# Patient Record
Sex: Male | Born: 1985
Health system: Southern US, Community
[De-identification: ages and names within clinical notes are randomized; demographics above are authoritative.]

## PROBLEM LIST (undated history)

## (undated) DIAGNOSIS — F32A Depression, unspecified: Secondary | ICD-10-CM

## (undated) DIAGNOSIS — F419 Anxiety disorder, unspecified: Secondary | ICD-10-CM

## (undated) HISTORY — PX: NO PAST SURGERIES: SHX2092

## (undated) HISTORY — PX: OTHER SURGICAL HISTORY: SHX169

---

## 2015-09-18 ENCOUNTER — Inpatient Hospital Stay
Admission: RE | Admit: 2015-09-18 | Discharge: 2015-09-21 | DRG: 885 | Disposition: A | Discharge status: Home / Self Care | Payer: No Typology Code available for payment source | Source: Intra-hospital | Attending: Psychiatry | Admitting: Psychiatry

## 2015-09-18 ENCOUNTER — Emergency Department
Admission: EM | Admit: 2015-09-18 | Discharge: 2015-09-18 | Disposition: A | Discharge status: Other Acute Inpatient Hospital | Payer: Self-pay | Attending: Emergency Medicine | Admitting: Emergency Medicine

## 2015-09-18 ENCOUNTER — Encounter: Payer: Self-pay | Admitting: Psychiatry

## 2015-09-18 DIAGNOSIS — R45851 Suicidal ideations: Secondary | ICD-10-CM

## 2015-09-18 DIAGNOSIS — Z716 Tobacco abuse counseling: Secondary | ICD-10-CM

## 2015-09-18 DIAGNOSIS — F17200 Nicotine dependence, unspecified, uncomplicated: Secondary | ICD-10-CM | POA: Insufficient documentation

## 2015-09-18 DIAGNOSIS — F32A Depression, unspecified: Secondary | ICD-10-CM

## 2015-09-18 DIAGNOSIS — Z888 Allergy status to other drugs, medicaments and biological substances status: Secondary | ICD-10-CM | POA: Diagnosis not present

## 2015-09-18 DIAGNOSIS — F319 Bipolar disorder, unspecified: Principal | ICD-10-CM | POA: Diagnosis present

## 2015-09-18 DIAGNOSIS — F1220 Cannabis dependence, uncomplicated: Secondary | ICD-10-CM | POA: Diagnosis present

## 2015-09-18 DIAGNOSIS — F17210 Nicotine dependence, cigarettes, uncomplicated: Secondary | ICD-10-CM | POA: Diagnosis present

## 2015-09-18 DIAGNOSIS — Z5181 Encounter for therapeutic drug level monitoring: Secondary | ICD-10-CM | POA: Insufficient documentation

## 2015-09-18 DIAGNOSIS — F329 Major depressive disorder, single episode, unspecified: Secondary | ICD-10-CM | POA: Insufficient documentation

## 2015-09-18 LAB — COMPREHENSIVE METABOLIC PANEL
ALT: 15 U/L — ABNORMAL LOW (ref 17–63)
ANION GAP: 4 — AB (ref 5–15)
AST: 25 U/L (ref 15–41)
Albumin: 4.4 g/dL (ref 3.5–5.0)
Alkaline Phosphatase: 62 U/L (ref 38–126)
BILIRUBIN TOTAL: 0.8 mg/dL (ref 0.3–1.2)
BUN: 14 mg/dL (ref 6–20)
CALCIUM: 9.5 mg/dL (ref 8.9–10.3)
CO2: 29 mmol/L (ref 22–32)
Chloride: 107 mmol/L (ref 101–111)
Creatinine, Ser: 0.89 mg/dL (ref 0.61–1.24)
GLUCOSE: 107 mg/dL — AB (ref 65–99)
POTASSIUM: 3.7 mmol/L (ref 3.5–5.1)
Sodium: 140 mmol/L (ref 135–145)
TOTAL PROTEIN: 7.2 g/dL (ref 6.5–8.1)

## 2015-09-18 LAB — CBC
HEMATOCRIT: 44.4 % (ref 40.0–52.0)
Hemoglobin: 15.3 g/dL (ref 13.0–18.0)
MCH: 29.5 pg (ref 26.0–34.0)
MCHC: 34.5 g/dL (ref 32.0–36.0)
MCV: 85.5 fL (ref 80.0–100.0)
Platelets: 159 10*3/uL (ref 150–440)
RBC: 5.2 MIL/uL (ref 4.40–5.90)
RDW: 13.5 % (ref 11.5–14.5)
WBC: 6.7 10*3/uL (ref 3.8–10.6)

## 2015-09-18 LAB — SALICYLATE LEVEL: Salicylate Lvl: <4 mg/dL (ref 2.8–30.0)

## 2015-09-18 LAB — URINE DRUG SCREEN, QUALITATIVE (ARMC ONLY)
AMPHETAMINES, UR SCREEN: NOT DETECTED
BENZODIAZEPINE, UR SCRN: NOT DETECTED
Barbiturates, Ur Screen: NOT DETECTED
Cannabinoid 50 Ng, Ur ~~LOC~~: POSITIVE — AB
Cocaine Metabolite,Ur ~~LOC~~: NOT DETECTED
MDMA (Ecstasy)Ur Screen: NOT DETECTED
Methadone Scn, Ur: NOT DETECTED
OPIATE, UR SCREEN: NOT DETECTED
PHENCYCLIDINE (PCP) UR S: NOT DETECTED
Tricyclic, Ur Screen: NOT DETECTED

## 2015-09-18 LAB — ETHANOL: Alcohol, Ethyl (B): <5 mg/dL (ref <5)

## 2015-09-18 LAB — ACETAMINOPHEN LEVEL: Acetaminophen (Tylenol), Serum: <10 ug/mL — ABNORMAL LOW (ref 10–30)

## 2015-09-18 MED ORDER — QUETIAPINE FUMARATE 25 MG PO TABS: 100.0000 mg | ORAL_TABLET | Freq: Every day | ORAL | Status: DC

## 2015-09-18 MED ORDER — HYDROXYZINE HCL 25 MG PO TABS
25.0000 mg | ORAL_TABLET | ORAL | Status: DC | PRN
  Administered 2015-09-19: 25 mg via ORAL
  Filled 2015-09-18: qty 1

## 2015-09-18 MED ORDER — NICOTINE 21 MG/24HR TD PT24: 21.0000 mg | MEDICATED_PATCH | Freq: Every day | TRANSDERMAL | Status: DC

## 2015-09-18 MED ORDER — ACETAMINOPHEN 325 MG PO TABS: 650.0000 mg | ORAL_TABLET | Freq: Four times a day (QID) | ORAL | Status: DC | PRN

## 2015-09-18 MED ORDER — TEMAZEPAM 15 MG PO CAPS
15.0000 mg | ORAL_CAPSULE | Freq: Every evening | ORAL | Status: DC | PRN
Start: 2015-09-18 — End: 2015-09-21
  Administered 2015-09-19: 15 mg via ORAL
  Filled 2015-09-18: qty 1

## 2015-09-18 MED ORDER — HYDROXYZINE HCL 25 MG PO TABS: 25.0000 mg | ORAL_TABLET | ORAL | Status: DC | PRN

## 2015-09-18 MED ORDER — ALUM & MAG HYDROXIDE-SIMETH 200-200-20 MG/5ML PO SUSP: 30.0000 mL | ORAL | Status: DC | PRN

## 2015-09-18 MED ORDER — MAGNESIUM HYDROXIDE 400 MG/5ML PO SUSP: 30.0000 mL | Freq: Every day | ORAL | Status: DC | PRN

## 2015-09-18 MED ORDER — TEMAZEPAM 15 MG PO CAPS: 15.0000 mg | ORAL_CAPSULE | Freq: Every evening | ORAL | Status: DC | PRN

## 2015-09-18 MED ORDER — QUETIAPINE FUMARATE 100 MG PO TABS
100.0000 mg | ORAL_TABLET | Freq: Every day | ORAL | Status: DC
  Administered 2015-09-18 – 2015-09-20 (×3): 100 mg via ORAL
  Filled 2015-09-18 (×3): qty 1

## 2015-09-18 NOTE — ED Triage Notes (Addendum)
Pt comes into the ED via EMS from home stating "I need help" I will no kill myself but sometimes I dont know what else to do"..denies any self harm PTA.[name]. Pt [facility] and dx with possible testicular cancer but has not followed up with the specialist.

## 2015-09-18 NOTE — ED Notes (Signed)
ENVIRONMENTAL ASSESSMENT Potentially harmful objects out of patient reach: Yes Personal belongings secured: Yes Patient dressed in hospital provided attire only: Yes Plastic bags out of patient reach: Yes Patient care equipment (cords, cables, call bells, lines, and drains) shortened, removed, or accounted for: Yes Equipment and supplies removed from bottom of stretcher: Yes Potentially toxic materials out of patient reach: Yes Sharps container removed or out of patient reach: Yes  Patient currently in dayroom watching television. No signs of acute distress noted at this time. Maintained on 15 minute checks and observation by security camera for safety.
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
## 2015-09-18 NOTE — ED Notes (Signed)
Patient currently denies SI/HI/AVH and pain. Patient aware of inpatient admission. All belongings given to patient and transferred to unit. Patient escorted with Engineer, materialssecurity officer.

## 2015-09-18 NOTE — ED Provider Notes (Signed)
[facility] Emergency Department Provider Note  ____________________________________________  Time seen: Approximately 1:20 PM  I have reviewed the triage vital signs and the nursing notes.   HISTORY  Chief Complaint Suicidal   HPI Eric Rush is a 30 y.o. male with h/o bipolar disorder not on meds for 11 years who presents for depression. Patient reports that he is feeling very depressed and needs help. He has not been seen by a psychiatrist or been on meds for 11 years. He reports that he has a 14 year old son and he is trying to get well for his son. He denies SI or HI. He does report one prior episode of SA where he tried to hang himself. He denies hallucinations. He smokes MJ but denies any other drugs or alcohol. He reports anger issues as well.  History reviewed. No pertinent past medical history.  There are no active problems to display for this patient.   History reviewed. No pertinent surgical history.  Prior to Admission medications   Not on File    Allergies Depakote [divalproex sodium]  No family history on file.  Social History Social History  Substance Use Topics  . Smoking status: Current Every Day Smoker  . Smokeless tobacco: Never Used  . Alcohol use No    Review of Systems  Constitutional: Negative for fever. Eyes: Negative for visual changes. ENT: Negative for sore throat. Cardiovascular: Negative for chest pain. Respiratory: Negative for shortness of breath. Gastrointestinal: Negative for abdominal pain, vomiting or diarrhea. Genitourinary: Negative for dysuria. Musculoskeletal: Negative for back pain. Skin: Negative for rash. Neurological: Negative for headaches, weakness or numbness. Psych: + depression, no SI or HI  ____________________________________________   PHYSICAL EXAM:  VITAL SIGNS: ED Triage Vitals [09/18/15 1232]  Enc Vitals Group     BP (!) 144/75     Pulse Rate 72     Resp 17   Temp 98.4 F (36.9 C)     Temp Source Oral     SpO2 99 %     Weight 165 lb (74.8 kg)     Height  (1.854 m)     Head Circumference      Peak Flow      Pain Score      Pain Loc      Pain Edu?      Excl. in GC?     Constitutional: Alert and oriented. Well appearing and in no apparent distress. HEENT:      Head: Normocephalic and atraumatic.         Eyes: Conjunctivae are normal. Sclera is non-icteric. EOMI. PERRL      Mouth/Throat: Mucous membranes are moist.       Neck: Supple with no signs of meningismus. Cardiovascular: Regular rate and rhythm. No murmurs, gallops, or rubs. 2+ symmetrical distal pulses are present in all extremities. No JVD. Respiratory: Normal respiratory effort. Lungs are clear to auscultation bilaterally. No wheezes, crackles, or rhonchi.  Gastrointestinal: Soft, non tender, and non distended with positive bowel sounds. No rebound or guarding. Genitourinary: No CVA tenderness. Musculoskeletal: Nontender with normal range of motion in all extremities. No edema, cyanosis, or erythema of extremities. Neurologic: Normal speech and language. Face is symmetric. Moving all extremities. No gross focal neurologic deficits are appreciated. Skin: Skin is warm, dry and intact. No rash noted. Psychiatric: Mood and affect are depressed. Speech and behavior are normal. No SI/HI/hallucinations  ____________________________________________   LABS (all labs ordered are listed, but only  abnormal results are displayed)  Labs Reviewed  COMPREHENSIVE METABOLIC PANEL - Abnormal; Notable for the following:       Result Value   Glucose, Bld 107 (*)    ALT 15 (*)    Anion gap 4 (*)    All other components within normal limits  ACETAMINOPHEN LEVEL - Abnormal; Notable for the following:    Acetaminophen (Tylenol), Serum <10 (*)    All other components within normal limits  URINE DRUG SCREEN, QUALITATIVE ([facility] ONLY) - Abnormal; Notable for the following:    Cannabinoid 50 Ng,  Ur [place] POSITIVE (*)    All other components within normal limits  ETHANOL  SALICYLATE LEVEL  CBC   ____________________________________________  EKG  none ____________________________________________  RADIOLOGY  none  ____________________________________________   PROCEDURES  Procedure(s) performed: None Procedures Critical Care performed:  None ____________________________________________   INITIAL IMPRESSION / ASSESSMENT AND PLAN / ED COURSE  30 y.o. male with h/o bipolar disorder not on meds for 11 years who presents for worsening depression and requesting help. No SI or HI. Patient is here voluntarily and requesting help. He wants to get better for his son. No indication for IVC at this time. Will consult psychiatry.  Clinical Course   _________________________ 4:05 PM on 09/18/2015 -----------------------------------------  Patient is medically cleared and awaiting psychiatric evaluation.  Pertinent labs & imaging results that were available during my care of the patient were reviewed by me and considered in my medical decision making (see chart for details).    ____________________________________________   FINAL CLINICAL IMPRESSION(S) / ED DIAGNOSES  Final diagnoses:  Depression      NEW MEDICATIONS STARTED DURING THIS VISIT:  New Prescriptions   No medications on file     Note:  This document was prepared using Dragon voice recognition software and may include unintentional dictation errors.    Nita Sicklearolina Minnette Merida, MD 09/18/15 [phone]

## 2015-09-18 NOTE — Consult Note (Signed)
[place] [facility] Consult   Reason for Consult:  Consult for 30 year old man who presented himself to the emergency room requesting assessment for depression Referring Physician:  Alfred Levins Patient Identification: Eric Rush MRN:  [id number] Principal Diagnosis: Bipolar 1 disorder, depressed ([place]) Diagnosis:   Patient Active Problem List   Diagnosis Date Noted  . Bipolar 1 disorder, depressed ([place]) [F31.9] 09/18/2015  . Suicidal ideation [R45.851] 09/18/2015    Total Time spent with patient: 1 hour  Subjective:   Eric Rush is a 30 y.o. male patient admitted with "I just need some help".  HPI:  Patient interviewed. Chart reviewed. Labs and vitals reviewed. Case discussed with TTS and emergency room physician. 30 year old man presented to the emergency room stating that he had been feeling depressed and having trouble sleeping and it is all getting worse. He says he was diagnosed with bipolar disorder when he was an adolescent but has not had any treatment since he was 30 years old. His mood stays down and depressed all the time and has been getting worse. He also has frequent problems with anger and irritability and temper outbursts which she regrets. He sleeps poorly at night and says that he has nightmares frequently. Feels down and negative about himself. Has recently been feeling more hopeless and having thoughts of suicide. Wants to get some help because he feels responsible for his infant child now.  Medical history: He says at one time somebody told him he had a nodule on his long but apparently it wasn't thought to be serious enough that anything further was done about it. No other active medical complaints except a history of a hydrocele and enlarged testicle.  Substance abuse history: Says he does not use alcohol. Admits that he smokes marijuana says he doesn't do it very frequently only every couple weeks. Has had times in the past of heavier drug use but  is not doing that currently.  Social history: Patient works as a Theme park manager. Currently he is living with a roommate. Has a 24-year-old child by a next girlfriend. Doesn't have much family support.  Past Psychiatric History: Reports that as a child he was diagnosed with bipolar disorder and had several hospitalizations. He recalls multiple medicines in the past including lithium and Depakote in several antidepressants as well as medicines for attention deficit disorder. Stop getting treatment when he was 31 years old with one exception. About 5 years ago he was hospitalized in [place] after trying to hang himself. No other admitted suicide attempts.  Risk to Self: Suicidal Ideation: Yes-Currently Present Suicidal Intent: No-Not Currently/Within Last 6 Months Is patient at risk for suicide?: No Suicidal Plan?: No-Not Currently/Within Last 6 Months (Pt states his actions are impulsive ) Access to Means: Yes Specify Access to Suicidal Means: Pt attempted and did not follow through  What has been your use of drugs/alcohol within the last 12 months?: THC How many times?: 1 Other Self Harm Risks: Cutting Bx Triggers for Past Attempts: Family contact, Other personal contacts, Spouse contact Intentional Self Injurious Behavior: Cutting Comment - Self Injurious Behavior: Pt has a hx of cutting  Risk to Others: Homicidal Ideation: No Thoughts of Harm to Others: No Current Homicidal Intent: No Current Homicidal Plan: No Access to Homicidal Means: No Identified Victim: N/A History of harm to others?: No Assessment of Violence: None Noted Violent Behavior Description: None noted  Does patient have access to weapons?: No Criminal Charges Pending?: No Does patient have a court date: No Prior  Inpatient Therapy: Prior Inpatient Therapy: Yes Prior Therapy Dates: Prior to age 37 Prior Therapy Facilty/Provider(s): Multiple  (Ages 84 y.o- 30 y.o ) Reason for Treatment: Bipolar Depression  Prior Outpatient  Therapy: Prior Outpatient Therapy: No Prior Therapy Dates: N/A Prior Therapy Facilty/Provider(s): N/A Reason for Treatment: N/A Does patient have an ACCT team?: No Does patient have Intensive In-House Services?  : No Does patient have [facility] services? : No Does patient have P4CC services?: No  Past Medical History: History reviewed. No pertinent past medical history. History reviewed. No pertinent surgical history. Family History: No family history on file. Family Psychiatric  History: He says that he is told that his biological mother had mood problems and drug and alcohol problems but he never knew her personally. Social History:  History  Alcohol Use No     History  Drug use: Unknown  . Types: Marijuana    Social History   Social History  . Marital status: Single    Spouse name: N/A  . Number of children: N/A  . Years of education: N/A   Social History Main Topics  . Smoking status: Current Every Day Smoker  . Smokeless tobacco: Never Used  . Alcohol use No  . Drug use: Unknown    Types: Marijuana  . Sexual activity: Not Asked   Other Topics Concern  . None   Social History Narrative  . None   Additional Social History:    Allergies:   Allergies  Allergen Reactions  . Depakote [Divalproex Sodium] Other (See Comments)    liver    Labs:  Results for orders placed or performed during the hospital encounter of 09/18/15 (from the past 48 hour(s))  Comprehensive metabolic panel     Status: Abnormal   Collection Time: 09/18/15 12:36 PM  Result Value Ref Range   Sodium 140 135 - 145 mmol/L   Potassium 3.7 3.5 - 5.1 mmol/L   Chloride 107 101 - 111 mmol/L   CO2 29 22 - 32 mmol/L   Glucose, Bld 107 (H) 65 - 99 mg/dL   BUN 14 6 - 20 mg/dL   Creatinine, Ser 0.89 0.61 - 1.24 mg/dL   Calcium 9.5 8.9 - 10.3 mg/dL   Total Protein 7.2 6.5 - 8.1 g/dL   Albumin 4.4 3.5 - 5.0 g/dL   AST 25 15 - 41 U/L   ALT 15 (L) 17 - 63 U/L   Alkaline Phosphatase 62 38 - 126 U/L    Total Bilirubin 0.8 0.3 - 1.2 mg/dL   GFR calc non Af Amer >60 >60 mL/min   GFR calc Af Amer >60 >60 mL/min    Comment: (NOTE) The eGFR has been calculated using the CKD EPI equation. This calculation has not been validated in all clinical situations. eGFR's persistently <60 mL/min signify possible Chronic Kidney Disease.    Anion gap 4 (L) 5 - 15  Ethanol     Status: None   Collection Time: 09/18/15 12:36 PM  Result Value Ref Range   Alcohol, Ethyl (B) <5 <5 mg/dL    Comment:        LOWEST DETECTABLE LIMIT FOR SERUM ALCOHOL IS 5 mg/dL FOR MEDICAL PURPOSES ONLY   Salicylate level     Status: None   Collection Time: 09/18/15 12:36 PM  Result Value Ref Range   Salicylate Lvl <7.1 2.8 - 30.0 mg/dL  Acetaminophen level     Status: Abnormal   Collection Time: 09/18/15 12:36 PM  Result Value Ref Range  Acetaminophen (Tylenol), Serum <10 (L) 10 - 30 ug/mL    Comment:        THERAPEUTIC CONCENTRATIONS VARY SIGNIFICANTLY. A RANGE OF 10-30 ug/mL MAY BE AN EFFECTIVE CONCENTRATION FOR MANY PATIENTS. HOWEVER, SOME ARE BEST TREATED AT CONCENTRATIONS OUTSIDE THIS RANGE. ACETAMINOPHEN CONCENTRATIONS >150 ug/mL AT 4 HOURS AFTER INGESTION AND >50 ug/mL AT 12 HOURS AFTER INGESTION ARE OFTEN ASSOCIATED WITH TOXIC REACTIONS.   cbc     Status: None   Collection Time: 09/18/15 12:36 PM  Result Value Ref Range   WBC 6.7 3.8 - 10.6 K/uL   RBC 5.20 4.40 - 5.90 MIL/uL   Hemoglobin 15.3 13.0 - 18.0 g/dL   HCT 44.4 40.0 - 52.0 %   MCV 85.5 80.0 - 100.0 fL   MCH 29.5 26.0 - 34.0 pg   MCHC 34.5 32.0 - 36.0 g/dL   RDW 13.5 11.5 - 14.5 %   Platelets 159 150 - 440 K/uL  Urine Drug Screen, Qualitative     Status: Abnormal   Collection Time: 09/18/15 12:36 PM  Result Value Ref Range   Tricyclic, Ur Screen NONE DETECTED NONE DETECTED   Amphetamines, Ur Screen NONE DETECTED NONE DETECTED   MDMA (Ecstasy)Ur Screen NONE DETECTED NONE DETECTED   Cocaine Metabolite,Ur [place] NONE DETECTED NONE  DETECTED   Opiate, Ur Screen NONE DETECTED NONE DETECTED   Phencyclidine (PCP) Ur S NONE DETECTED NONE DETECTED   Cannabinoid 50 Ng, Ur [place] POSITIVE (A) NONE DETECTED   Barbiturates, Ur Screen NONE DETECTED NONE DETECTED   Benzodiazepine, Ur Scrn NONE DETECTED NONE DETECTED   Methadone Scn, Ur NONE DETECTED NONE DETECTED    Comment: (NOTE) 062  Tricyclics, urine               Cutoff 1000 ng/mL 200  Amphetamines, urine             Cutoff 1000 ng/mL 300  MDMA (Ecstasy), urine           Cutoff 500 ng/mL 400  Cocaine Metabolite, urine       Cutoff 300 ng/mL 500  Opiate, urine                   Cutoff 300 ng/mL 600  Phencyclidine (PCP), urine      Cutoff 25 ng/mL 700  Cannabinoid, urine              Cutoff 50 ng/mL 800  Barbiturates, urine             Cutoff 200 ng/mL 900  Benzodiazepine, urine           Cutoff 200 ng/mL 1000 Methadone, urine                Cutoff 300 ng/mL 1100 1200 The urine drug screen provides only a preliminary, unconfirmed 1300 analytical test result and should not be used for non-medical 1400 purposes. Clinical consideration and professional judgment should 1500 be applied to any positive drug screen result due to possible 1600 interfering substances. A more specific alternate chemical method 1700 must be used in order to obtain a confirmed analytical result.  1800 Gas chromato graphy / mass spectrometry (GC/MS) is the preferred 1900 confirmatory method.     Current Facility-Administered Medications  Medication Dose Route Frequency Provider Last Rate Last Dose  . hydrOXYzine (ATARAX/VISTARIL) tablet 25 mg  25 mg Oral Q4H PRN Gonzella Lex, MD      . temazepam (RESTORIL) capsule 15 mg  15 mg Oral QHS  PRN Gonzella Lex, MD       No current outpatient prescriptions on file.    Musculoskeletal: Strength & Muscle Tone: within normal limits Gait & Station: normal Patient leans: N/A  Psychiatric Specialty Exam: Physical Exam  Nursing note and vitals  reviewed. Constitutional: He appears well-developed and well-nourished.  HENT:  Head: Normocephalic and atraumatic.  Eyes: Conjunctivae are normal. Pupils are equal, round, and reactive to light.  Neck: Normal range of motion.  Cardiovascular: Regular rhythm and normal heart sounds.   Respiratory: Effort normal. No respiratory distress.  GI: Soft.  Musculoskeletal: Normal range of motion.  Neurological: He is alert.  Skin: Skin is warm and dry.  Psychiatric: Judgment normal. His speech is delayed. He is slowed. Cognition and memory are normal. He exhibits a depressed mood. He expresses suicidal ideation. He expresses no suicidal plans.    Review of Systems  Constitutional: Negative.   HENT: Negative.   Eyes: Negative.   Respiratory: Negative.   Cardiovascular: Negative.   Gastrointestinal: Negative.   Musculoskeletal: Negative.   Skin: Negative.   Neurological: Negative.   Psychiatric/Behavioral: Positive for depression, substance abuse and suicidal ideas. Negative for hallucinations and memory loss. The patient is nervous/anxious and has insomnia.     Blood pressure (!) 144/75, pulse 72, temperature 98.4 F (36.9 C), temperature source Oral, resp. rate 17, height 6' 1"  (1.854 m), weight 74.8 kg (165 lb), SpO2 99 %.Body mass index is 21.77 kg/m.  General Appearance: Casual  Eye Contact:  Fair  Speech:  Normal Rate  Volume:  Decreased  Mood:  Anxious and Depressed  Affect:  Constricted and Tearful  Thought Process:  Goal Directed  Orientation:  Full (Time, Place, and Person)  Thought Content:  Logical  Suicidal Thoughts:  Yes.  without intent/plan  Homicidal Thoughts:  No  Memory:  Immediate;   Good Recent;   Poor Remote;   Fair  Judgement:  Fair  Insight:  Fair  Psychomotor Activity:  Decreased  Concentration:  Concentration: Fair  Recall:  [employer] of Knowledge:  Fair  Language:  Fair  Akathisia:  No  Handed:  Right  AIMS (if indicated):     Assets:   Communication Skills Desire for Improvement Housing Physical Health Resilience  ADL's:  Intact  Cognition:  WNL  Sleep:        Treatment Plan Summary: Daily contact with patient to assess and evaluate symptoms and progress in treatment, Medication management and Plan 30 year old man who is reporting multiple symptoms of depression including depressed sad mood, anxiety, irritability. Frequent suicidal thoughts. Feeling like he is out of control. History of bipolar diagnosis in the past. Patient will be admitted to the psychiatric ward. I will initiate low dose of Seroquel at night to begin treatment of bipolar depression. When necessary medication will be provided for anxiety. Full lab checks will be completed including hemoglobin A1c and lipid panel. Patient agrees to plan.  Disposition: Recommend psychiatric Inpatient admission when medically cleared.  Alethia Berthold, MD 09/18/2015 4:58 PM

## 2015-09-18 NOTE — ED Notes (Signed)
Patient was brought to the emergency room to be evaluated after feeling symptoms of bipolar disorder (not sleeping well, becoming enraged all of a sudden, racing thoughts). Patient a week ago took a bottle of sleeping pills in his mouth and attempted to chew them but couldn't go through with it. Patient recently was estranged from his girlfriend and only has limited visits with his son. Patient currently denies SI but feels hopeless, denies HI/AVH and pain. Patient agreeable with plan to go to the inpatient unit.  Patient is calm and cooperative, no signs of acute distress noted. Maintained on 15 minute checks and observation by security camera for safety.

## 2015-09-18 NOTE — ED Notes (Signed)
Patient stated that he did not have any numbers memorized in order to let mother of child know that he was admitted to the hospital. Patient requested that staff find the number for [employer] which is his employer so that he could let them know he was in the hospital. Staff found number, patient requested that it be placed with his belongings.

## 2015-09-19 DIAGNOSIS — F319 Bipolar disorder, unspecified: Principal | ICD-10-CM

## 2015-09-19 LAB — LIPID PANEL
CHOL/HDL RATIO: 3.6 ratio
Cholesterol: 149 mg/dL (ref 0–200)
HDL: 41 mg/dL (ref >40)
LDL CALC: 88 mg/dL (ref 0–99)
Triglycerides: 98 mg/dL (ref <150)
VLDL: 20 mg/dL (ref 0–40)

## 2015-09-19 LAB — HEMOGLOBIN A1C: Hgb A1c MFr Bld: 5.5 % (ref 4.0–6.0)

## 2015-09-19 LAB — TSH: TSH: 1.809 u[IU]/mL (ref 0.350–4.500)

## 2015-09-19 MED ORDER — OXCARBAZEPINE 150 MG PO TABS
150.0000 mg | ORAL_TABLET | Freq: Two times a day (BID) | ORAL | Status: DC
  Administered 2015-09-19 – 2015-09-21 (×5): 150 mg via ORAL
  Filled 2015-09-19 (×5): qty 1
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
## 2015-09-19 NOTE — Care Management (Signed)
Eric Rush seems to be in better shape then on his arrival. Chaplain made sure to offer counseling and support. Currently, Eric Rush feels better and does not desire any services, however the Chaplain will check in with him later on in the day. His demeanor was very welcoming and warm this morning.

## 2015-09-19 NOTE — [facility] Suicide Risk Assessment (Signed)
[facility] INPATIENT:  Family/Significant Other Suicide Prevention Education  Suicide Prevention Education:  Patient Refusal for Family/Significant Other Suicide Prevention Education: The patient Eric Rush has refused to provide written consent for family/significant other to be provided Family/Significant Other Suicide Prevention Education during admission and/or prior to discharge.  Physician notified. Pt stated he wants to get treatment and will contact collateral when he gets better.   Lynden OxfordKadijah R Sebastiana Wuest, MSW, LCSW-A 09/19/2015, 10:56 AM

## 2015-09-19 NOTE — Tx Team (Signed)
Initial Interdisciplinary Treatment Plan   PATIENT STRESSORS: Financial difficulties Marital or family conflict Substance abuse   PATIENT STRENGTHS: Ability for insight Capable of independent living Motivation for treatment/growth   PROBLEM LIST: Problem List/Patient Goals Date to be addressed Date deferred Reason deferred Estimated date of resolution  Mood Liabilities/Swings 09/18/2015     Depressive Symptoms 09/18/2015     Marijuana Dependent 09/18/2015                                          DISCHARGE CRITERIA:  Improved stabilization in mood, thinking, and/or behavior Reduction of life-threatening or endangering symptoms to within safe limits  PRELIMINARY DISCHARGE PLAN: Attend aftercare/continuing care group Return to previous living arrangement  PATIENT/FAMIILY INVOLVEMENT: This treatment plan has been presented to and reviewed with the patient, Lavona Mounderence James Schoonmaker.  The patient was given the opportunity to ask questions and make suggestions.  Ara Mano T Hser Belanger 09/19/2015, 2:22 AM

## 2015-09-19 NOTE — Tx Team (Signed)
Interdisciplinary Treatment Plan Update (Adult)         Date: 09/19/2015   Time Reviewed: 10:30 AM   Progress in Treatment: Improving Attending groups: Yes  Participating in groups: Yes  Taking medication as prescribed: Yes  Tolerating medication: Yes  Family/Significant other contact made: Pt refused family contact Patient understands diagnosis: Yes  Discussing patient identified problems/goals with staff: Yes  Medical problems stabilized or resolved: Yes  Denies suicidal/homicidal ideation: Yes  Issues/concerns per patient self-inventory: Yes  Other:   New problem(s) identified: N/A   Discharge Plan or Barriers: see below    Reason for Continuation of Hospitalization:   Depression   Anxiety   Medication Stabilization   Comments: N/A   Estimated length of stay: 3-5 days    Patient is a 30 year old male admitted for depression and suicidal ideation. Patient lives in [place], [place].   30 year old man presented to the emergency room stating that he had been feeling depressed and having trouble sleeping and it is all getting worse. He says he was diagnosed with bipolar disorder when he was an adolescent but has not had any treatment since he was 30 years old. His mood stays down and depressed all the time and has been getting worse. He also has frequent problems with anger and irritability and temper outbursts which she regrets. He sleeps poorly at night and says that he has nightmares frequently. Feels down and negative about himself. Has recently been feeling more hopeless and having thoughts of suicide. Wants to get some help because he feels responsible for his infant child now.  Patient will benefit from crisis stabilization, medication evaluation, group therapy, and psycho education in addition to case management for discharge planning. Patient and CSW reviewed pt's identified goals and treatment plan. Pt verbalized understanding and agreed to treatment plan.    Review of  initial/current patient goals per problem list:  1. Goal(s): Patient will participate in aftercare plan   Met: Goal progressing   Target date: 3-5 days post admission date   As evidenced by: Patient will participate within aftercare plan AEB aftercare provider and housing plan at discharge being identified.   CSW still assessing proper aftercare plans at this time. Possible [place] follow-up.  2. Goal (s): Patient will exhibit decreased depressive symptoms and suicidal ideations.   Met: No  Target date: 3-5 days post admission date   As evidenced by: Patient will utilize self-rating of depression at 3 or below and demonstrate decreased signs of depression or be deemed stable for discharge by MD.   Pt reports a depression score of a 6 at this time. Hoping that getting back on his medications will help with depressive symptoms.  3. Goal(s): Patient will demonstrate decreased signs and symptoms of anxiety.   Met: Goal progressing  Target date: 3-5 days post admission date   As evidenced by: Patient will utilize self-rating of anxiety at 3 or below and demonstrated decreased signs of anxiety, or be deemed stable for discharge by MD   Patient reports an anxiety score of 4 at this time. States he has not been able to sleep lately and is causing anxiety symptoms.  Attendees:  Patient: Eric Rush Family:  Physician: Jennye Boroughs, MD    09/19/2015 10:30 AM  Nursing: Polly Cobia, RN     09/19/2015 10:30 AM  Clinical Social Worker: Emilie Rutter, LCSWA  09/19/2015 10:30 AM
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
## 2015-09-19 NOTE — Plan of Care (Signed)
Problem: Education: Goal: Knowledge of [facility] Education information/materials will improve Outcome: Not Progressing Review of current plan of care. Patient sleepy in bed this am. Therapy groups encouraged.

## 2015-09-19 NOTE — Progress Notes (Signed)
A&Ox3, denied pain, "Hopeful, I want to be there for my 29 year old son .... I checked myself in, I brought myself in because of my mood, I can be happy now and then crash ..." Observed interacting well with peers in the Day Room.

## 2015-09-19 NOTE — Progress Notes (Signed)
Recreation Therapy Notes  INPATIENT RECREATION THERAPY ASSESSMENT  Patient Details Name: Lavona Mounderence James Sweezy MRN: [id number] DOB: 1985/02/24 Today's Date: 09/19/2015  Patient Stressors: Relationship, Friends, Other (Comment) (Girlfriend broke up with him 2 months ago because he had a one night stand and got another girl pregnant; lack of supportive friends; doesn't know what is going on with son - son's mother doesn't hang around good people - she uses son to make pt do thing)  Coping Skills:   Isolate, Substance Abuse, Exercise, Art/Dance, Talking, Music, Sports, Other (Comment) (Punching bag)  Personal Challenges: Anger, Communication, Concentration, Decision-Making, Expressing Yourself, Problem-Solving, Relationships, Self-Esteem/Confidence, Restaurant manager, fast foodocial Interaction, Stress Management, Trusting Others  Leisure Interests (2+):  Music - Write music, Individual - Other (Comment) Research officer, trade union(Skateboarding)  Awareness of Community Resources:  Yes  Community Resources:  [employer], [employer]  Current Use: Yes  If no, Barriers?:    Patient Strengths:  Great father, determined  Patient Identified Areas of Improvement:  All the thing I said I struggled with  Current Recreation Participation:  Writing music, skateboarding  Patient Goal for Hospitalization:  Get back on the right medication and learn what he can out of group and get group therapy in the community  [place]   Current SI (including self-harm):  No  Current HI:  No  Consent to Intern Participation: N/A   Jacquelynn CreeGreene,Bailley Guilford M, LRT/CTRS 09/19/2015, 3:42 PM

## 2015-09-19 NOTE — Progress Notes (Signed)
Patient with sad affect, cooperative behavior with meals. Minimal interaction with peers. Pleasant when Clinical research associatewriter initiates interaction. No SI/HI/AVH at this time. Sleepy in bed this am. Therapy groups encouraged. Safety maintained.

## 2015-09-19 NOTE — BHH Suicide Risk Assessment (Signed)
[facility] Admission Suicide Risk Assessment   Nursing information obtained from:  Patient Demographic factors:  Male, Caucasian, Low socioeconomic status Current Mental Status:  Suicidal ideation indicated by patient, Suicide plan, Belief that plan would result in death Loss Factors:  Financial problems / change in socioeconomic status Historical Factors:  Impulsivity Risk Reduction Factors:  Responsible for children under 29 years of age, Sense of responsibility to family  Total Time spent with patient: 1 hour Principal Problem: Bipolar 1 disorder, depressed (HCC) Diagnosis:   Patient Active Problem List   Diagnosis Date Noted  . Bipolar 1 disorder, depressed (HCC) [F31.9] 09/18/2015    Priority: High  . Suicidal ideation [R45.851] 09/18/2015  . Cannabis use disorder, moderate, dependence (HCC) [F12.20] 09/18/2015  . Tobacco use disorder [F17.200] 09/18/2015   Subjective Data:  Depression, mood instability, suicida idetion  Continued Clinical Symptoms:  Alcohol Use Disorder Identification Test Final Score (AUDIT): 0 The "Alcohol Use Disorders Identification Test", Guidelines for Use in Primary Care, Second Edition.  World Science writer [facility]). Score between 0-7:  no or low risk or alcohol related problems. Score between 8-15:  moderate risk of alcohol related problems. Score between 16-19:  high risk of alcohol related problems. Score 20 or above:  warrants further diagnostic evaluation for alcohol dependence and treatment.   CLINICAL FACTORS:   Bipolar Disorder:   Depressive phase   Musculoskeletal: Strength & Muscle Tone: within normal limits Gait & Station: normal Patient leans: N/A  Psychiatric Specialty Exam: Physical Exam  Nursing note and vitals reviewed.   Review of Systems  Psychiatric/Behavioral: Positive for depression and suicidal ideas. The patient has insomnia.   All other systems reviewed and are negative.   Blood pressure 124/76, pulse 60, temperature  98.2 F (36.8 C), temperature source Oral, resp. rate 20, height  (1.854 m), weight 68 kg (150 lb), SpO2 100 %.Body mass index is 19.79 kg/m.  General Appearance: Casual  Eye Contact:  Good  Speech:  Clear and Coherent  Volume:  Normal  Mood:  Depressed and Dysphoric  Affect:  Tearful  Thought Process:  Goal Directed  Orientation:  Full (Time, Place, and Person)  Thought Content:  WDL  Suicidal Thoughts:  Yes.  with intent/plan  Homicidal Thoughts:  No  Memory:  Immediate;   Fair Recent;   Fair Remote;   Fair  Judgement:  Fair  Insight:  Fair  Psychomotor Activity:  Normal  Concentration:  Concentration: Fair and Attention Span: Fair  Recall:  [employer] of Knowledge:  Fair  Language:  Fair  Akathisia:  No  Handed:  Right  AIMS (if indicated):     Assets:  Communication Skills Desire for Improvement Financial Resources/Insurance Housing Physical Health Resilience Social Support Transportation Vocational/Educational  ADL's:  Intact  Cognition:  WNL  Sleep:  Number of Hours: 7      COGNITIVE FEATURES THAT CONTRIBUTE TO RISK:  None    SUICIDE RISK:   Moderate:  Frequent suicidal ideation with limited intensity, and duration, some specificity in terms of plans, no associated intent, good self-control, limited dysphoria/symptomatology, some risk factors present, and identifiable protective factors, including available and accessible social support.   PLAN OF CARE:  Hospital admission, medication management, discharge planning.  Mr. Karner  is a 30-ear-old male with a history of bipolar disorder admtted for worsening of dpessonand and suicidal ideation in the conext of social stressors.  1.  Suicidal ideation. The patient is able to contract for safety n the hospital.  2.   Mood,.  He was started on Seoquel and Trileptal for mood stabilization.  3.  Smoking. Nicotine paths available.  4.  Cannabis abuse. The patient minimizes his problems.  5.  Disposition.  He will be discharged to home. [facility].  I certify that inpatient services furnished can reasonably be expected to improve the patient's condition.  Kristine LineaJolanta Maira Christon, MD 09/19/2015, 9:54 AM

## 2015-09-19 NOTE — Progress Notes (Signed)
Recreation Therapy Notes  Date: 08.09.17 Time: 1:15 pm Location: Craft Room  Group Topic: Self-esteem  Goal Area(s) Addresses:  Patient will write at least one positive trait about self. Patient will verbalize benefit of having healthy self-esteem.  Behavioral Response: Attentive, Interactive  Intervention: I Am  Activity: Patients were given a worksheet with the letter I on it and instructed to write as many positive traits about themselves inside the letter.  Education: LRT educated patients on ways they can increase their self-esteem.  Education Outcome: Acknowledges education/In group clarification offered   Clinical Observations/Feedback: Patient completed activity by writing positive traits about himself. Patient contributed to group discussion by stating that it was easy and difficult to think of positive traits and why, how it felt to list positive traits, how his self-esteem affects him, and how he can increase his self-esteem.  Jacquelynn CreeGreene,Timothy Townsel M, LRT/CTRS 09/19/2015 3:24 PM

## 2015-09-19 NOTE — BHH Group Notes (Signed)
BHH LCSW Group Therapy  09/19/2015 12:07 PM  Type of Therapy:  Group Therapy  Participation Level:  Pt did not attend group. CSW invited pt to group.   Summary of Progress/Problems: Emotional Regulation: Patients will identify both negative and positive emotions. They will discuss emotions they have difficulty regulating and how they impact their lives. Patients will be asked to identify healthy coping skills to combat unhealthy reactions to negative emotions.    Lacey Wallman G. Garnette CzechSampson MSW, LCSWA 09/19/2015, 12:07 PM

## 2015-09-19 NOTE — BHH Group Notes (Signed)
BHH Group Notes:  (Nursing/MHT/Case Management/Adjunct)  Date:  09/19/2015  Time:  5:06 PM  Type of Therapy:  Psychoeducational Skills  Participation Level:  Active  Participation Quality:  Appropriate  Affect:  Appropriate  Cognitive:  Appropriate  Insight:  Appropriate  Engagement in Group:  Engaged and Supportive  Modes of Intervention:  Discussion and Education  Summary of Progress/Problems:  Eric PennerKristen J Saydie Rush 09/19/2015, 5:06 PM

## 2015-09-19 NOTE — Plan of Care (Signed)
Problem: Safety: Goal: Ability to remain free from injury will improve Outcome: Progressing Medications administered as ordered (Seroquel 100 mg qhs)by the physician, medications Therapeutic Effects, SEs and Adverse effects discussed, questions encouraged; no PRN given, 15 minute checks maintained for safety, clinical and moral support provided, patient encouraged to continue to express feelings and demonstrate safe care. Patient remain free from harm, will continue to monitor.

## 2015-09-20 LAB — PROLACTIN: Prolactin: 28.1 ng/mL — ABNORMAL HIGH (ref 4.0–15.2)

## 2015-09-20 MED ORDER — OXCARBAZEPINE 150 MG PO TABS: 150.0000 mg | ORAL_TABLET | Freq: Two times a day (BID) | ORAL | 1 refills | Status: AC

## 2015-09-20 MED ORDER — HYDROXYZINE HCL 25 MG PO TABS: 25.0000 mg | ORAL_TABLET | ORAL | 1 refills | Status: DC | PRN

## 2015-09-20 MED ORDER — QUETIAPINE FUMARATE 100 MG PO TABS: 100.0000 mg | ORAL_TABLET | Freq: Every day | ORAL | 1 refills | Status: DC

## 2015-09-20 MED ORDER — OXCARBAZEPINE 150 MG PO TABS: 150.0000 mg | ORAL_TABLET | Freq: Two times a day (BID) | ORAL | 1 refills | Status: DC

## 2015-09-20 MED ORDER — QUETIAPINE FUMARATE 100 MG PO TABS: 100.0000 mg | ORAL_TABLET | Freq: Every day | ORAL | 1 refills | Status: AC

## 2015-09-20 NOTE — BHH Group Notes (Signed)
BHH LCSW Group Therapy  09/20/2015 10:39 AM  Type of Therapy:  Group Therapy  Participation Level:  Active  Participation Quality:  Appropriate and Sharing  Affect:  Appropriate  Cognitive:  Alert and Appropriate  Insight:  Developing/Improving and Engaged  Engagement in Therapy:  Engaged and Improving  Modes of Intervention:  Activity, Discussion, Education and Support  Summary of Progress/Problems: Balance in life: Patients will discuss the concept of balance and how it looks and feels to be unbalanced. Pt will identify areas in their life that is unbalanced and ways to become more balanced. Pt actively participated in group and was able to develop insight to areas he could make improvements. Pt states his son motivates him and that he wants to maintain his mental health to be a better dad to his son.     Nicey Krah G. Garnette CzechSampson MSW, LCSWA 09/20/2015, 10:43 AM

## 2015-09-20 NOTE — Progress Notes (Signed)
D: Patient is alert and oriented on the unit this shift. Patient attended and   participated in groups today. Patient denies suicidal ideation, homicidal ideation, auditory or visual hallucinations at the present time.  A: Scheduled medications are administered to patient as per MD orders. Emotional support and encouragement are provided. Patient is maintained on q.15 minute safety checks. Patient is informed to notify staff with questions or concerns. R: No adverse medication reactions are noted. Patient is cooperative with medication administration and treatment plan today. Patient is receptive and cooperative on the unit at this time. Patient  Isolative on the unit this shift. Patient contracts for safety at this time. Patient remains safe at this time.

## 2015-09-20 NOTE — BHH Group Notes (Signed)
Goals Group  Date/Time: 09/20/15  10:34AM Type of Therapy and Topic: Group Therapy: Goals Group: SMART Goals  ?  Participation Level: Moderate  ?  Description of Group:  ?  The purpose of a daily goals group is to assist and guide patients in setting recovery/wellness-related goals. The objective is to set goals as they relate to the crisis in which they were admitted. Patients will be using SMART goal modalities to set measurable goals. Characteristics of realistic goals will be discussed and patients will be assisted in setting and processing how one will reach their goal. Facilitator will also assist patients in applying interventions and coping skills learned in psycho-education groups to the SMART goal and process how one will achieve defined goal.  ?  Therapeutic Goals:  ?  -Patients will develop and document one goal related to or their crisis in which brought them into treatment.  -Patients will be guided by LCSW using SMART goal setting modality in how to set a measurable, attainable, realistic and time sensitive goal.  -Patients will process barriers in reaching goal.  -Patients will process interventions in how to overcome and successful in reaching goal.  ?  Patient's Goal: Pt's goal today is to take treatment seriously by taking medications, following up with therapist, and actively participating in groups.  ?  Therapeutic Modalities:  Motivational Interviewing  Cognitive Behavioral Therapy  Crisis Intervention Model  SMART goals setting  Hampton AbbotKadijah Osmany Azer, MSW, LCSW-A 09/20/15  10:34AM

## 2015-09-20 NOTE — Progress Notes (Signed)
Patient is pleasant & cooperative.Verbalized that his depression is better.Denies suicidal or homicidal ideations & AV hallucinations.Stated that he is looking forward to a better life with his family.Attended groups.Compliant with medications.

## 2015-09-20 NOTE — Plan of Care (Signed)
Problem: [facility] Participation in Recreation Therapeutic Interventions Goal: STG-Patient will identify at least five coping skills for ** STG: Coping Skills - Within 4 treatment sessions, patient will verbalize at least 5 coping skills for anger in each of 2 treatment sessions to increase anger management skills post d/c.  Outcome: Progressing Treatment Session 1; Completed 1 out of 2: At approximately 10:50 am, LRT met with patient in craft room. Patient verbalized 5 coping skills for anger. Patient verbalized what triggers him to get angry, how his body responds to anger, and how he is going to remember to use his healthy coping skills. LRT provided suggestions as well.  Leonette Monarch, LRT/CTRS 08.10.17 3:50 pm Goal: STG-Other Recreation Therapy Goal (Specify) STG: Stress Management - Within 4 treatment sessions, patient will verbalize understanding of the stress management techniques in each of 2 treatment sessions to increase stress management skills post d/c.  Outcome: Progressing Treatment Session 1; Completed 1 out of 2: At approximately 10:50 am, LRT met with patient in craft room. LRT educated and provided patient with handouts on stress management techniques. Patient verbalized understanding. LRT encouraged patient to read over and practice the stress management techniques.  Leonette Monarch, LRT/CTRS 08.10.17 3:51 pm

## 2015-09-20 NOTE — BHH Suicide Risk Assessment (Signed)
[facility]   Principal Problem: Bipolar 1 disorder, depressed [facility](HCC) Discharge Diagnoses:  Patient Active Problem List   Diagnosis Date Noted  . Bipolar 1 disorder, depressed (HCC) [F31.9] 09/18/2015    Priority: High  . Suicidal ideation [R45.851] 09/18/2015  . Cannabis use disorder, moderate, dependence (HCC) [F12.20] 09/18/2015  . Tobacco use disorder [F17.200] 09/18/2015    Total Time spent with patient: 30 minutes  Musculoskeletal: Strength & Muscle Tone: within normal limits Gait & Station: normal Patient leans: N/A  Psychiatric Specialty Exam: Review of Systems  Psychiatric/Behavioral: The patient is nervous/anxious.   All other systems reviewed and are negative.   Blood pressure 107/70, pulse 67, temperature 98.4 F (36.9 C), temperature source Oral, resp. rate 20, height 6\' 1"  (1.854 m), weight 68 kg (150 lb), SpO2 100 %.Body mass index is 19.79 kg/m.  General Appearance: Casual  Eye Contact::  Good  Speech:  Clear and Coherent409  Volume:  Normal  Mood:  Anxious  Affect:  Appropriate  Thought Process:  Goal Directed  Orientation:  Full (Time, Place, and Person)  Thought Content:  WDL  Suicidal Thoughts:  No  Homicidal Thoughts:  No  Memory:  Immediate;   Fair Recent;   Fair Remote;   Fair  Judgement:  Impaired  Insight:  Present  Psychomotor Activity:  Normal  Concentration:  Fair  Recall:  [employer]  Fund of Knowledge:Fair  Language: Fair  Akathisia:  No  Handed:  Right  AIMS (if indicated):     Assets:  Communication Skills Desire for Improvement Housing Physical Health Resilience Social Support  Sleep:  Number of Hours: 6.3  Cognition: WNL  ADL's:  Intact   Mental Status Per Nursing Assessment::   On Admission:  Suicidal ideation indicated by patient, Suicide plan, Belief that plan would result in death  Demographic Factors:  Male, Adolescent or young adult and Caucasian  Loss Factors: Loss of significant  relationship  Historical Factors: Family history of mental illness or substance abuse and Impulsivity  Risk Reduction Factors:   Responsible for children under 418 years of age, Sense of responsibility to family, Employed, Living with another person, especially a relative and Positive social support  Continued Clinical Symptoms:  Bipolar Disorder:   Depressive phase Alcohol/Substance Abuse/Dependencies  Cognitive Features That Contribute To Risk:  None    Suicide Risk:  Minimal: No identifiable suicidal ideation.  Patients presenting with no risk factors but with morbid ruminations; may be classified as minimal risk based on the severity of the depressive symptoms  Follow-up Information    [facility]. Go on 09/24/2015.   Why:  Please arrive to the walk-in clinic at [facility] for an assessment for medication management and therapy.  Arrive as early as possible for prompt service. Please call Unk PintoHarvey Bryant at [phone] for questions and assistance.  Contact information: [facility] [address] [place] [address] Ph: [phone] Fax: [phone]          Plan Of Care/Follow-up recommendations:  Activity:  As tolerated. Diet:  Regular. Other:  Keep follow-up appointments.  Kristine LineaJolanta Pucilowska, MD 09/20/2015, 7:08 PM

## 2015-09-20 NOTE — Progress Notes (Signed)
[facility] MD Progress Note  09/20/2015 6:24 PM Randle Shatzer  MRN:  [id number]  Subjective:  Mr. Deridder reports feeling much better today. He slept last night. He tolerates medications well. He no longer is in distress. He denies suicidal or homicidal thoughts and is asking for discharge tomorrow. He is pleasant and cooperative. He interacts with peers and staff appropriately. Good group participation.  Principal Problem: Bipolar 1 disorder, depressed (HCC) Diagnosis:   Patient Active Problem List   Diagnosis Date Noted  . Bipolar 1 disorder, depressed (HCC) [F31.9] 09/18/2015    Priority: High  . Suicidal ideation [R45.851] 09/18/2015  . Cannabis use disorder, moderate, dependence (HCC) [F12.20] 09/18/2015  . Tobacco use disorder [F17.200] 09/18/2015   Total Time spent with patient: 20 minutes  Past Psychiatric History: Bipolar disorder.  Past Medical History: History reviewed. No pertinent past medical history. History reviewed. No pertinent surgical history. Family History: History reviewed. No pertinent family history. Family Psychiatric  History: Bipolar disorder. Social History:  History  Alcohol Use No     History  Drug use: Unknown  . Types: Marijuana    Social History   Social History  . Marital status: Single    Spouse name: N/A  . Number of children: N/A  . Years of education: N/A   Social History Main Topics  . Smoking status: Current Every Day Smoker  . Smokeless tobacco: Never Used  . Alcohol use No  . Drug use: Unknown    Types: Marijuana  . Sexual activity: Not Asked   Other Topics Concern  . None   Social History Narrative  . None   Additional Social History:                         Sleep: Fair  Appetite:  Fair  Current Medications: Current Facility-Administered Medications  Medication Dose Route Frequency Provider Last Rate Last Dose  . acetaminophen (TYLENOL) tablet 650 mg  650 mg Oral Q6H PRN Audery Amel, MD      . alum &  mag hydroxide-simeth (MAALOX/MYLANTA) 200-200-20 MG/5ML suspension 30 mL  30 mL Oral Q4H PRN Audery Amel, MD      . hydrOXYzine (ATARAX/VISTARIL) tablet 25 mg  25 mg Oral Q4H PRN Audery Amel, MD   25 mg at 09/19/15 2153  . magnesium hydroxide (MILK OF MAGNESIA) suspension 30 mL  30 mL Oral Daily PRN Audery Amel, MD      . nicotine (NICODERM CQ - dosed in mg/24 hours) patch 21 mg  21 mg Transdermal Daily Jolanta B Pucilowska, MD      . OXcarbazepine (TRILEPTAL) tablet 150 mg  150 mg Oral BID Shari Prows, MD   150 mg at 09/20/15 0942  . QUEtiapine (SEROQUEL) tablet 100 mg  100 mg Oral QHS Audery Amel, MD   100 mg at 09/19/15 2153  . temazepam (RESTORIL) capsule 15 mg  15 mg Oral QHS PRN Audery Amel, MD   15 mg at 09/19/15 2153    Lab Results:  Results for orders placed or performed during the hospital encounter of 09/18/15 (from the past 48 hour(s))  Hemoglobin A1c     Status: None   Collection Time: 09/19/15  7:38 AM  Result Value Ref Range   Hgb A1c MFr Bld 5.5 4.0 - 6.0 %  Lipid panel, fasting     Status: None   Collection Time: 09/19/15  7:38 AM  Result Value Ref  Range   Cholesterol 149 0 - 200 mg/dL   Triglycerides 98 <782 mg/dL   HDL 41 >95 mg/dL   Total CHOL/HDL Ratio 3.6 RATIO   VLDL 20 0 - 40 mg/dL   LDL Cholesterol 88 0 - 99 mg/dL    Comment:        Total Cholesterol/HDL:CHD Risk Coronary Heart Disease Risk Table                     Men   Women  1/2 Average Risk   3.4   3.3  Average Risk       5.0   4.4  2 X Average Risk   9.6   7.1  3 X Average Risk  23.4   11.0        Use the calculated Patient Ratio above and the CHD Risk Table to determine the patient's CHD Risk.        ATP III CLASSIFICATION (LDL):  <100     mg/dL   Optimal  621-308  mg/dL   Near or Above                    Optimal  130-159  mg/dL   Borderline  657-846  mg/dL   High  >962     mg/dL   Very High   Prolactin     Status: Abnormal   Collection Time: 09/19/15  7:38 AM   Result Value Ref Range   Prolactin 28.1 (H) 4.0 - 15.2 ng/mL    Comment: (NOTE) Performed At: [facility] [address] [place], [place] [id number] Mila Homer MD [id number]   TSH     Status: None   Collection Time: 09/19/15  7:38 AM  Result Value Ref Range   TSH 1.809 0.350 - 4.500 uIU/mL    Blood Alcohol level:  Lab Results  Component Value Date   ETH <5 09/18/2015    Metabolic Disorder Labs: Lab Results  Component Value Date   HGBA1C 5.5 09/19/2015   Lab Results  Component Value Date   PROLACTIN 28.1 (H) 09/19/2015   Lab Results  Component Value Date   CHOL 149 09/19/2015   TRIG 98 09/19/2015   HDL 41 09/19/2015   CHOLHDL 3.6 09/19/2015   VLDL 20 09/19/2015   LDLCALC 88 09/19/2015    Physical Findings: AIMS: Facial and Oral Movements Muscles of Facial Expression: None, normal Lips and Perioral Area: None, normal Jaw: None, normal Tongue: None, normal,Extremity Movements Upper (arms, wrists, hands, fingers): None, normal Lower (legs, knees, ankles, toes): None, normal, Trunk Movements Neck, shoulders, hips: None, normal, Overall Severity Severity of abnormal movements (highest score from questions above): None, normal Incapacitation due to abnormal movements: None, normal Patient's awareness of abnormal movements (rate only patient's report): No Awareness, Dental Status Current problems with teeth and/or dentures?: No Does patient usually wear dentures?: No  CIWA:    COWS:     Musculoskeletal: Strength & Muscle Tone: within normal limits Gait & Station: normal Patient leans: N/A  Psychiatric Specialty Exam: Physical Exam  Nursing note and vitals reviewed.   Review of Systems  Psychiatric/Behavioral: Positive for depression. The patient is nervous/anxious.     Blood pressure 107/70, pulse 67, temperature 98.4 F (36.9 C), temperature source Oral, resp. rate 20, height 6\' 1"  (1.854 m), weight 68 kg (150 lb), SpO2 100 %.Body mass  index is 19.79 kg/m.  General Appearance: Casual  Eye Contact:  Good  Speech:  Clear  and Coherent  Volume:  Normal  Mood:  Anxious  Affect:  Appropriate  Thought Process:  Goal Directed  Orientation:  Full (Time, Place, and Person)  Thought Content:  WDL  Suicidal Thoughts:  No  Homicidal Thoughts:  No  Memory:  Immediate;   Fair Recent;   Fair Remote;   Fair  Judgement:  Impaired  Insight:  Shallow  Psychomotor Activity:  Normal  Concentration:  Concentration: Fair and Attention Span: Fair  Recall:  FiservFair  Fund of Knowledge:  Fair  Language:  Fair  Akathisia:  No  Handed:  Right  AIMS (if indicated):     Assets:  Communication Skills Desire for Improvement Financial Resources/Insurance Housing Physical Health Resilience Social Support  ADL's:  Intact  Cognition:  WNL  Sleep:  Number of Hours: 6.3     Treatment Plan Summary: Daily contact with patient to assess and evaluate symptoms and progress in treatment and Medication management   Mr. Rolene ArbourFreas  is a 30-ear-old male with a history of bipolar disorder admtted for worsening of depression and suicidal ideation in the conext of social stressors.  1.  Suicidal ideation. The patient is able to contract for safety n the hospital.  2.   Mood,.  He was started on Seoquel and Trileptal for mood stabilization.  3.  Smoking. Nicotine patch is available.  4.  Cannabis abuse. The patient minimizes his problems.  5.  Metabolic syndrome monitoring. Lipid panel, TSH, hemoglobin A1c are normal. Prolactin 28.   6.  Disposition. He will be discharged to home. [facility].  Kristine LineaJolanta Pucilowska, MD 09/20/2015, 6:24 PM

## 2015-09-20 NOTE — BHH Group Notes (Signed)
[facility] Group Notes:  (Nursing/MHT/Case Management/Adjunct)  Date:  09/20/2015  Time:  4:34 AM  Type of Therapy:  Group Therapy  Participation Level:  Active  Participation Quality:  Appropriate  Affect:  Appropriate  Cognitive:  Appropriate  Insight:  Appropriate  Engagement in Group:  Engaged  Modes of Intervention:  Discussion  Summary of Progress/Problems: Group was held outside. Pt stated that he met his goal of getting more rest. Pt stated that he feels more peaceful and is looking forward to go home to his family.   Jenetta Downer Kennede Lusk 09/20/2015, 4:34 AM

## 2015-09-20 NOTE — Progress Notes (Signed)
Recreation Therapy Notes  Date: 08.10.17 Time: 1:15 pm  Location: Craft Room  Group Topic: Leisure Education  Goal Area(s) Addresses:  Patient will identify activities for each letter of the alphabet. Patient will verbalize ability to integrate positive leisure into life post d/c. Patient will verbalize ability to use leisure as a Associate Professorcoping skill.  Behavioral Response: Attentive, Interactive  Intervention: Leisure Alphabet  Activity: Patients were given a Leisure Information systems managerAlphabet worksheet and instructed to write healthy leisure activities for each letter of the alphabet.  Education: LRT educated patients on what they need to participate in leisure.  Education Outcome: Acknowledges education/In group clarification offered   Clinical Observations/Feedback: Patient completed activity by writing leisure activities. Patient contributed to group discussion by stating some leisure activities, what he needs to participate in leisure, how he can integrate leisure into his schedule, and what makes leisure a good coping skill.  Jacquelynn CreeGreene,Beryle Zeitz M, LRT/CTRS 09/20/2015 3:43 PM

## 2015-09-20 NOTE — Plan of Care (Signed)
Problem: [facility] Participation in Recreation Therapeutic Interventions Goal: STG-Patient will identify at least five coping skills for ** STG: Coping Skills - Within 4 treatment sessions, patient will verbalize at least 5 coping skills for anger in each of 2 treatment sessions to increase anger management skills post d/c.  Outcome: Progressing Patient able to identify 2 coping skills at this time [facility]

## 2015-09-21 NOTE — Progress Notes (Signed)
  [facility]  Will you be returning to the same living situation after discharge:  Yes,  will return back with co-worker At discharge, do you have transportation home?: Yes,  child's mother will picl pt up. Do you have the ability to pay for your medications: No. Referred to medication clinic.  Release of information consent forms completed and in the chart;  Patient's signature needed at discharge.  Patient to Follow up at: Follow-up Information    [facility]. Go on 09/24/2015.   Why:  Please arrive to the walk-in clinic at [facility] for an assessment for medication management and therapy.  Arrive as early as possible for prompt service. Please call Unk PintoHarvey Bryant at [phone] for questions and assistance.  Contact information: [facility] [place] [address] [place] [address] Ph: [phone] Fax: [phone]          Next level of care provider has access to [facility] Link:no  Safety Planning and Suicide Prevention discussed: Yes,  reviewed with patient   Have you used any form of tobacco in the last 30 days? (Cigarettes, Smokeless Tobacco, Cigars, and/or Pipes): Yes  Has patient been referred to the Quitline?: Patient refused referral  Patient has been referred for addiction treatment: N/A - pt stated no substance abuse problems at this time.  Lynden OxfordKadijah R Chesni Vos, MSW, LCSW-A 09/21/2015, 12:16 PM
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
## 2015-09-21 NOTE — Tx Team (Signed)
Interdisciplinary Treatment Plan Update (Adult)         Date: 09/21/2015   Time Reviewed: 10:30 AM   Progress in Treatment: Improving Attending groups: Yes  Participating in groups: Yes  Taking medication as prescribed: Yes  Tolerating medication: Yes  Family/Significant other contact made: Pt refused family contact Patient understands diagnosis: Yes  Discussing patient identified problems/goals with staff: Yes  Medical problems stabilized or resolved: Yes  Denies suicidal/homicidal ideation: Yes  Issues/concerns per patient self-inventory: Yes  Other:   New problem(s) identified: N/A   Discharge Plan or Barriers: see below    Reason for Continuation of Hospitalization:   Depression   Anxiety   Medication Stabilization   Comments: N/A   Estimated discharge date: 09/21/2015    Patient is a 30 year old male admitted for depression and suicidal ideation. Patient lives in [place], [place].   30 year old man presented to the emergency room stating that he had been feeling depressed and having trouble sleeping and it is all getting worse. He says he was diagnosed with bipolar disorder when he was an adolescent but has not had any treatment since he was 30 years old. His mood stays down and depressed all the time and has been getting worse. He also has frequent problems with anger and irritability and temper outbursts which she regrets. He sleeps poorly at night and says that he has nightmares frequently. Feels down and negative about himself. Has recently been feeling more hopeless and having thoughts of suicide. Wants to get some help because he feels responsible for his infant child now.  Patient will benefit from crisis stabilization, medication evaluation, group therapy, and psycho education in addition to case management for discharge planning. Patient and CSW reviewed pt's identified goals and treatment plan. Pt verbalized understanding and agreed to treatment plan.    Review of  initial/current patient goals per problem list:  1. Goal(s): Patient will participate in aftercare plan   Met: Yes  Target date: 3-5 days post admission date   As evidenced by: Patient will participate within aftercare plan AEB aftercare provider and housing plan at discharge being identified.   Pt will follow-up with [facility].   2. Goal (s): Patient will exhibit decreased depressive symptoms and suicidal ideations.   Met: Yes  Target date: 3-5 days post admission date   As evidenced by: Patient will utilize self-rating of depression at 3 or below and demonstrate decreased signs of depression or be deemed stable for discharge by MD.   Pt reports a depression score of a 2 at this time. Pt denies SI/HI.  Pt reports he is safe for discharge. Adequate for discharge per MD.   3. Goal(s): Patient will demonstrate decreased signs and symptoms of anxiety.   Met: Yes  Target date: 3-5 days post admission date   As evidenced by: Patient will utilize self-rating of anxiety at 3 or below and demonstrated decreased signs of anxiety, or be deemed stable for discharge by MD   Adequate for discharge per MD.  Pt reports baseline symptoms of anxiety  Attendees:  Patient: Eric Rush Family:  Physician: Jennye Boroughs, MD    09/21/2015 10:30 AM  Nursing: Floyde Parkins, RN     09/21/2015 10:30 AM  Clinical Social Worker: Emilie Rutter, [place]  09/21/2015 10:30 AM   Recreational Therapist: Everitt Amber                         09/21/2015  10:30AM

## 2015-09-21 NOTE — [facility] Group Notes (Signed)
[facility] Group Notes:  (Nursing/MHT/Case Management/Adjunct)  Date:  09/21/2015  Time:  4:09 AM  Type of Therapy:  Psychoeducational Skills  Participation Level:  Active  Participation Quality:  Appropriate  Affect:  Appropriate  Cognitive:  Appropriate  Insight:  Appropriate and Good  Engagement in Group:  Engaged  Modes of Intervention:  Discussion, Socialization and Support  Summary of Progress/Problems:  Chancy MilroyLaquanda Y Mcgregor Tinnon 09/21/2015, 4:09 AM

## 2015-09-21 NOTE — BHH Suicide Risk Assessment (Signed)
[facility]   Principal Problem: Bipolar 1 disorder, depressed [facility](HCC) Discharge Diagnoses:  Patient Active Problem List   Diagnosis Date Noted  . Bipolar 1 disorder, depressed (HCC) [F31.9] 09/18/2015    Priority: High  . Suicidal ideation [R45.851] 09/18/2015  . Cannabis use disorder, moderate, dependence (HCC) [F12.20] 09/18/2015  . Tobacco use disorder [F17.200] 09/18/2015    Total Time spent with patient: 30 minutes  Musculoskeletal: Strength & Muscle Tone: within normal limits Gait & Station: normal Patient leans: N/A  Psychiatric Specialty Exam: Review of Systems  Psychiatric/Behavioral: The patient is nervous/anxious.   All other systems reviewed and are negative.   Blood pressure 128/80, pulse 63, temperature 98 F (36.7 C), temperature source Oral, resp. rate 20, height 6\' 1"  (1.854 m), weight 68 kg (150 lb), SpO2 100 %.Body mass index is 19.79 kg/m.  General Appearance: Casual  Eye Contact::  Good  Speech:  Clear and Coherent409  Volume:  Normal  Mood:  Anxious  Affect:  Appropriate  Thought Process:  Goal Directed  Orientation:  Full (Time, Place, and Person)  Thought Content:  WDL  Suicidal Thoughts:  No  Homicidal Thoughts:  No  Memory:  Immediate;   Fair Recent;   Fair Remote;   Fair  Judgement:  Impaired  Insight:  Present  Psychomotor Activity:  Normal  Concentration:  Fair  Recall:  FiservFair  Fund of Knowledge:Fair  Language: Fair  Akathisia:  No  Handed:  Right  AIMS (if indicated):     Assets:  Communication Skills Desire for Improvement Housing Physical Health Resilience Social Support  Sleep:  Number of Hours: 7.5  Cognition: WNL  ADL's:  Intact   Mental Status Per Nursing Assessment::   On Admission:  Suicidal ideation indicated by patient, Suicide plan, Belief that plan would result in death  Demographic Factors:  Male, Adolescent or young adult and Caucasian  Loss Factors: Loss of significant  relationship  Historical Factors: Family history of mental illness or substance abuse and Impulsivity  Risk Reduction Factors:   Responsible for children under 29 years of age, Sense of responsibility to family, Employed, Living with another person, especially a relative and Positive social support  Continued Clinical Symptoms:  Bipolar Disorder:   Depressive phase Alcohol/Substance Abuse/Dependencies  Cognitive Features That Contribute To Risk:  None    Suicide Risk:  Minimal: No identifiable suicidal ideation.  Patients presenting with no risk factors but with morbid ruminations; may be classified as minimal risk based on the severity of the depressive symptoms  Follow-up Information    [facility]. Go on 09/24/2015.   Why:  Please arrive to the walk-in clinic at [facility] for an assessment for medication management and therapy.  Arrive as early as possible for prompt service. Please call Unk PintoHarvey Bryant at [phone] for questions and assistance.  Contact information: [facility] of  [address] [place] [address] Ph: [phone] Fax: [phone]          Plan Of Care/Follow-up recommendations:  Activity:  As tolerated. Diet:  Regular. Other:  Keep follow-up appointments.  Kristine LineaJolanta Tien Spooner, MD 09/21/2015, 8:48 AM

## 2015-09-21 NOTE — Plan of Care (Signed)
Problem: [facility] Participation in Recreation Therapeutic Interventions Goal: STG-Patient will identify at least five coping skills for ** STG: Coping Skills - Within 4 treatment sessions, patient will verbalize at least 5 coping skills for anger in each of 2 treatment sessions to increase anger management skills post d/c.  Outcome: Completed/Met Date Met: 09/21/15 Treatment Session 2; Completed 2 out of 2: At approximately 11:45 am, LRT met with patient in community room. Patient verbalized 5 coping skills for anger. LRT encouraged patient to use his healthy coping skills when he feels himself getting angry to help calm himself down.  Leonette Monarch, LRT/CTRS 08.11.17 12:47 pm Goal: STG-Other Recreation Therapy Goal (Specify) STG: Stress Management - Within 4 treatment sessions, patient will verbalize understanding of the stress management techniques in each of 2 treatment sessions to increase stress management skills post d/c.  Outcome: Completed/Met Date Met: 09/21/15 Treatment Session 2; Completed 2 out of 2: At approximately 11:45 am, LRT met with patient in community room. Patient reported he read over and practiced the stress management techniques. Patient verbalized understanding and reported the techniques were helpful. LRT encouraged patient to continue practicing the stress management techniques.  Leonette Monarch, LRT/CTRS 08.11.17 12:49 pm

## 2015-09-21 NOTE — Plan of Care (Signed)
Problem: Education: Goal: Knowledge of the prescribed therapeutic regimen will improve Outcome: Progressing Patient could tell me what medications he was taking this evening.

## 2015-09-21 NOTE — Progress Notes (Signed)
Recreation Therapy Notes  INPATIENT RECREATION TR PLAN  Patient Details Name: Sheri Prows MRN: [id number] DOB: [birth year] Today's Date: 09/21/2015  Rec Therapy Plan Is patient appropriate for Therapeutic Recreation?: Yes Treatment times per week: At least once a week TR Treatment/Interventions: 1:1 session, Group participation (Comment) (Appropriate participation in daily recreational therapy tx)  Discharge Criteria Pt will be discharged from therapy if:: Treatment goals are met, Discharged Treatment plan/goals/alternatives discussed and agreed upon by:: Patient/family  Discharge Summary Short term goals set: See Care Plan Short term goals met: Complete Progress toward goals comments: One-to-one attended Which groups?: Leisure education, Coping skills, Self-esteem One-to-one attended: Stress management, anger management Reason goals not met: N/A Therapeutic equipment acquired: None Reason patient discharged from therapy: Discharge from hospital Pt/family agrees with progress & goals achieved: Yes Date patient discharged from therapy: 09/21/15   Leonette Monarch, LRT/CTRS 09/21/2015, 2:46 PM

## 2015-09-21 NOTE — Progress Notes (Signed)
Patient denies SI/HI, denies A/V hallucinations. Patient verbalizes understanding of discharge instructions, follow up care and prescriptions. Patient given all belongings from  locker. Patient escorted out by staff, transported by family. 

## 2015-09-21 NOTE — Progress Notes (Signed)
Recreation Therapy Notes  Date: 08.11.17 Time: 1:00 pm Location: Craft Room  Group Topic: Coping Skills  Goal Area(s) Addresses:  Patient will participate in healthy coping skill. Patient will verbalize benefit of using art as a coping skill.  Behavioral Response: Attentive, Left early  Intervention: Coloring  Activity: Patients were given coloring sheets to color and were instructed to think about the emotions they were feeling and what their minds were focused on.  Education: LRT educated patients on healthy coping skills.  Education Outcome: Patient left before LRT educated group.  Clinical Observations/Feedback: Patient colored coloring sheet. Patient left group at approximately 1:27 pm when he was called to the nurse's station. Patient did not return to group.  Jacquelynn CreeGreene,Druscilla Petsch M, LRT/CTRS 09/21/2015 2:44 PM

## 2015-09-21 NOTE — Progress Notes (Signed)
D: Patient appears somewhat anxious but states he's not. He states he's ready for discharge. He denies SI/HI/AVH. Denies pain. Patient has been visible in the milieu interacting with peers and staff.  A: Medication given with education. Encouragement provided.  R: Patient was compliant with medication. He has remained calm and cooperative. Safety maintained with 15 min checks.

## 2015-10-09 ENCOUNTER — Ambulatory Visit: Payer: No Typology Code available for payment source

## 2017-02-04 ENCOUNTER — Telehealth: Payer: Self-pay | Admitting: Pharmacy Technician

## 2017-02-04 NOTE — Telephone Encounter (Signed)
Patient failed to provide current poi.  No additional medication assistance will be provided by [facility] without the required proof of income documentation.  Patient notified by letter.  Edda Orea J. Rockwell Zentz Care Manager [facility] 

## 2017-02-28 ENCOUNTER — Emergency Department
Admission: EM | Admit: 2017-02-28 | Discharge: 2017-02-28 | Disposition: A | Discharge status: Home / Self Care | Payer: Self-pay | Attending: Emergency Medicine | Admitting: Emergency Medicine

## 2017-02-28 ENCOUNTER — Other Ambulatory Visit: Payer: Self-pay

## 2017-02-28 ENCOUNTER — Emergency Department: Payer: Self-pay

## 2017-02-28 ENCOUNTER — Encounter: Payer: Self-pay | Admitting: Emergency Medicine

## 2017-02-28 DIAGNOSIS — Y9301 Activity, walking, marching and hiking: Secondary | ICD-10-CM | POA: Insufficient documentation

## 2017-02-28 DIAGNOSIS — S91331A Puncture wound without foreign body, right foot, initial encounter: Secondary | ICD-10-CM | POA: Insufficient documentation

## 2017-02-28 DIAGNOSIS — Y999 Unspecified external cause status: Secondary | ICD-10-CM | POA: Insufficient documentation

## 2017-02-28 DIAGNOSIS — T148XXA Other injury of unspecified body region, initial encounter: Secondary | ICD-10-CM

## 2017-02-28 DIAGNOSIS — Z202 Contact with and (suspected) exposure to infections with a predominantly sexual mode of transmission: Secondary | ICD-10-CM | POA: Insufficient documentation

## 2017-02-28 DIAGNOSIS — W450XXA Nail entering through skin, initial encounter: Secondary | ICD-10-CM | POA: Insufficient documentation

## 2017-02-28 DIAGNOSIS — Y929 Unspecified place or not applicable: Secondary | ICD-10-CM | POA: Insufficient documentation

## 2017-02-28 DIAGNOSIS — Z23 Encounter for immunization: Secondary | ICD-10-CM | POA: Insufficient documentation

## 2017-02-28 LAB — CHLAMYDIA/NGC RT PCR (ARMC ONLY)
Chlamydia Tr: NOT DETECTED
N gonorrhoeae: NOT DETECTED

## 2017-02-28 MED ORDER — TETANUS-DIPHTH-ACELL PERTUSSIS 5-2.5-18.5 LF-MCG/0.5 IM SUSP
0.5000 mL | Freq: Once | INTRAMUSCULAR | Status: AC
  Administered 2017-02-28: 0.5 mL via INTRAMUSCULAR
  Filled 2017-02-28: qty 0.5

## 2017-02-28 NOTE — ED Provider Notes (Signed)
[facility] Emergency Department Provider Note  ____________________________________________  Time seen: Approximately 4:14 PM  I have reviewed the triage vital signs and the nursing notes.   HISTORY  Chief Complaint Foot Pain and Exposure to STD    HPI Eric Rush is a 32 y.o. male presents to the emergency department with a puncture wound along the plantar aspect of the right foot after stepping on a metal nail.  Patient denies weakness, radiculopathy or changes in sensation of the lower extremity.  Patient also reports concern for gonorrhea and his girlfriend has been recently diagnosed with gonorrhea.  Patient has experienced no dysuria, increased urinary frequency, penile discharge or flank pain.   History reviewed. No pertinent past medical history.  Patient Active Problem List   Diagnosis Date Noted  . Bipolar 1 disorder, depressed (HCC) 09/18/2015  . Suicidal ideation 09/18/2015  . Cannabis use disorder, moderate, dependence (HCC) 09/18/2015  . Tobacco use disorder 09/18/2015    History reviewed. No pertinent surgical history.  Prior to Admission medications   Medication Sig Start Date End Date Taking? Authorizing Provider  hydrOXYzine (ATARAX/VISTARIL) 25 MG tablet Take 1 tablet (25 mg total) by mouth every 4 (four) hours as needed for anxiety. 09/20/15   Pucilowska, Jolanta B, MD  OXcarbazepine (TRILEPTAL) 150 MG tablet Take 1 tablet (150 mg total) by mouth 2 (two) times daily. 09/20/15   Pucilowska, Jolanta B, MD  QUEtiapine (SEROQUEL) 100 MG tablet Take 1 tablet (100 mg total) by mouth at bedtime. 09/20/15   Pucilowska, Ellin GoodieJolanta B, MD    Allergies Depakote [divalproex sodium]  No family history on file.  Social History Social History   Tobacco Use  . Smoking status: Current Every Day Smoker    Packs/day: 0.20    Types: Cigarettes  . Smokeless tobacco: Never Used  Substance Use Topics  . Alcohol use: No  . Drug use: Unknown    Types: Marijuana     Review of Systems  Constitutional: No fever/chills Eyes: No visual changes. No discharge ENT: No upper respiratory complaints. Cardiovascular: no chest pain. Respiratory: no cough. No SOB. Gastrointestinal: No abdominal pain.  No nausea, no vomiting.  No diarrhea.  No constipation. Genitourinary: Negative for dysuria. No hematuria Musculoskeletal: Patient has right foot puncture.  Skin: Negative for rash, abrasions, lacerations, ecchymosis. Neurological: Negative for headaches, focal weakness or numbness.  ____________________________________________   PHYSICAL EXAM:  VITAL SIGNS: ED Triage Vitals  Enc Vitals Group     BP 02/28/17 1347 137/87     Pulse Rate 02/28/17 1347 77     Resp 02/28/17 1347 18     Temp 02/28/17 1347 98 F (36.7 C)     Temp Source 02/28/17 1347 Oral     SpO2 02/28/17 1347 98 %     Weight 02/28/17 1347 160 lb (72.6 kg)     Height 02/28/17 1347 6\' 1"  (1.854 m)     Head Circumference --      Peak Flow --      Pain Score 02/28/17 1346 6     Pain Loc --      Pain Edu? --      Excl. in GC? --      Constitutional: Alert and oriented. Well appearing and in no acute distress. Eyes: Conjunctivae are normal. PERRL. EOMI. Head: Atraumatic. Cardiovascular: Normal rate, regular rhythm. Normal S1 and S2.  Good peripheral circulation. Respiratory: Normal respiratory effort without tachypnea or retractions. Lungs CTAB. Good air entry to the bases  with no decreased or absent breath sounds. Gastrointestinal: Bowel sounds 4 quadrants. Soft and nontender to palpation. No guarding or rigidity. No palpable masses. No distention. No CVA tenderness. Musculoskeletal: Patient has 0.25 cm puncture along the plantar aspect of the right foot.  Patient performs full range of motion at the right ankle and is able to move all 5 right toes.  Palpable dorsalis pedis pulse bilaterally and symmetrically. Neurologic:  Normal speech and language. No gross focal  neurologic deficits are appreciated.  Skin:  Skin is warm, dry and intact. No rash noted.  ____________________________________________   LABS (all labs ordered are listed, but only abnormal results are displayed)  Labs Reviewed  CHLAMYDIA/NGC RT PCR ([facility] ONLY)   ____________________________________________  EKG   ____________________________________________  RADIOLOGY Geraldo Pitter, personally viewed and evaluated these images (plain radiographs) as part of my medical decision making, as well as reviewing the written report by the radiologist.  Dg Foot Complete Right  Result Date: 02/28/2017 CLINICAL DATA:  Patient stepped on a nail.  Pain. EXAM: RIGHT FOOT COMPLETE - 3+ VIEW COMPARISON:  None. FINDINGS: There is no evidence of fracture or dislocation. There is no evidence of arthropathy or other focal bone abnormality. Soft tissues are unremarkable. IMPRESSION: Negative. Electronically Signed   By: Ted Mcalpine M.D.   On: 02/28/2017 15:55    ____________________________________________    PROCEDURES  Procedure(s) performed:    Procedures    Medications  Tdap (BOOSTRIX) injection 0.5 mL (not administered)     ____________________________________________   INITIAL IMPRESSION / ASSESSMENT AND PLAN / ED COURSE  Pertinent labs & imaging results that were available during my care of the patient were reviewed by me and considered in my medical decision making (see chart for details).  Review of the [place] CSRS was performed in accordance of the [facility] prior to dispensing any controlled drugs.    Assessment and plan STD exposure Puncture Patient presents to the emergency department with a puncture to the plantar aspect of the right foot after stepping on a metal nail.  X-ray examination revealed no foreign bodies or fractures.  Patient's tetanus status is updated in the emergency department.  Patient also expressed concern for gonorrhea.  Gonorrhea  and Chlamydia testing were negative in the emergency department.  Patient education was provided regarding safe sex practices.  Patient was advised to seek care with local health department for full STD panel.  All patient questions were answered.    ____________________________________________  FINAL CLINICAL IMPRESSION(S) / ED DIAGNOSES  Final diagnoses:  Exposure to STD  Puncture wound      NEW MEDICATIONS STARTED DURING THIS VISIT:  ED Discharge Orders    None          This chart was dictated using voice recognition software/Dragon. Despite best efforts to proofread, errors can occur which can change the meaning. Any change was purely unintentional.    Orvil Feil, PA-C 02/28/17 1620    Sharyn Creamer, MD 03/01/17 [phone]

## 2017-02-28 NOTE — ED Triage Notes (Signed)
Stepped on nail with R foot last night. Also states has had STD exposure.

## 2019-08-26 IMAGING — DX DG FOOT COMPLETE 3+V*R*
3 series · 3 of 3 positions shown · non-contrast
Comparison: None.

CLINICAL DATA: Patient stepped on a nail.  Pain.

EXAM:
RIGHT FOOT COMPLETE - 3+ VIEW

[foot ap]
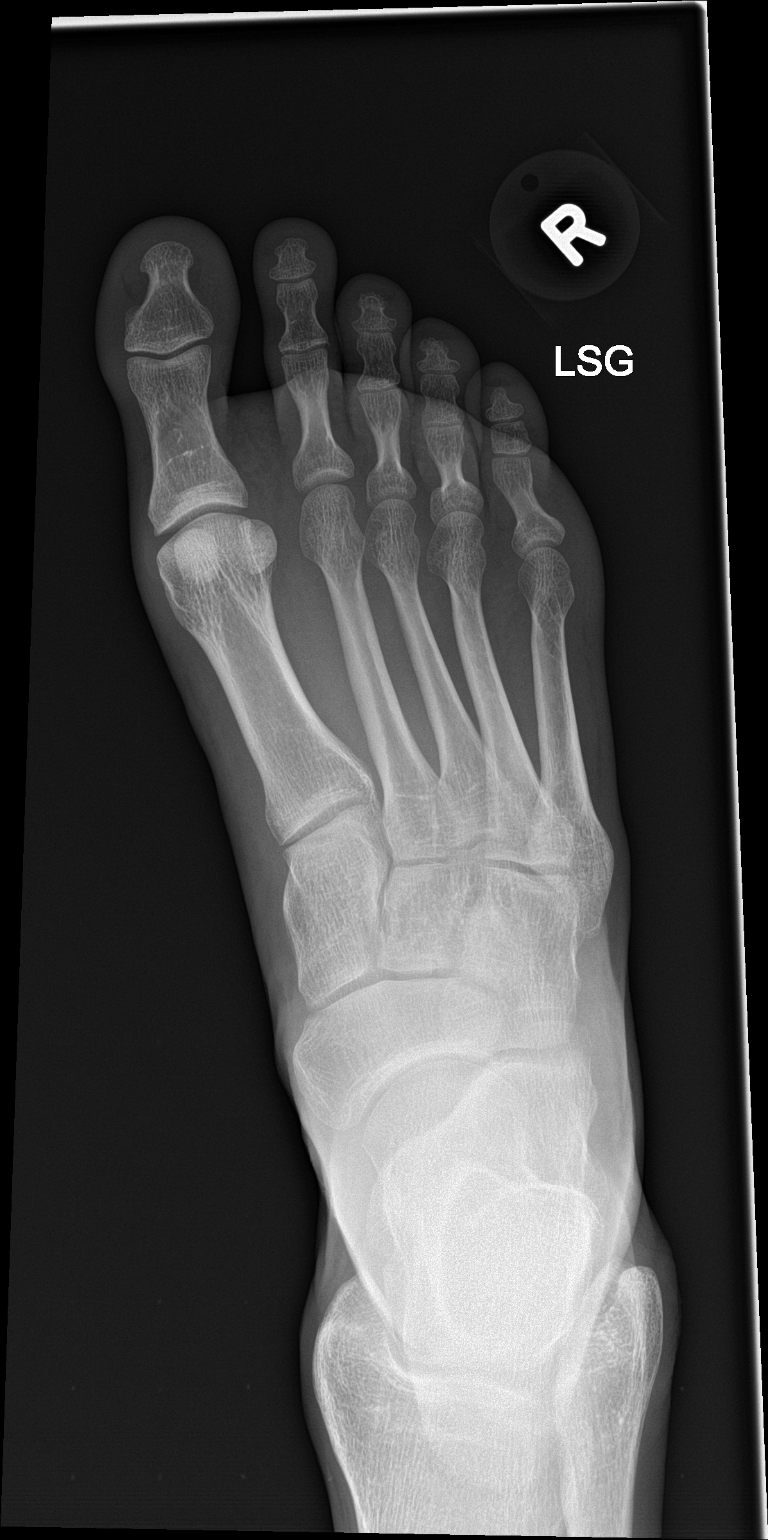

[foot obl]
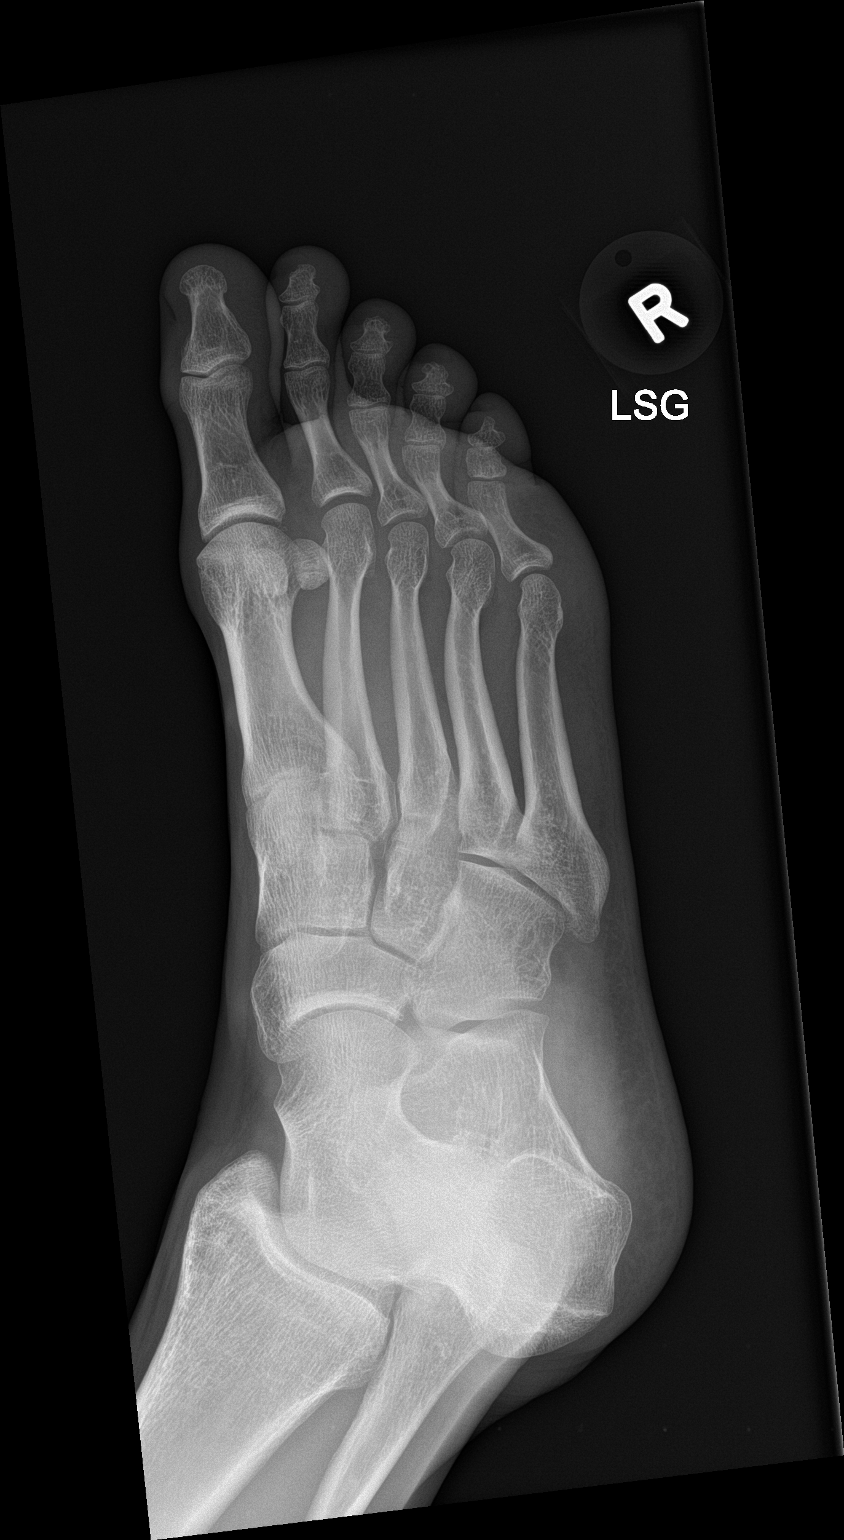

[foot lat]
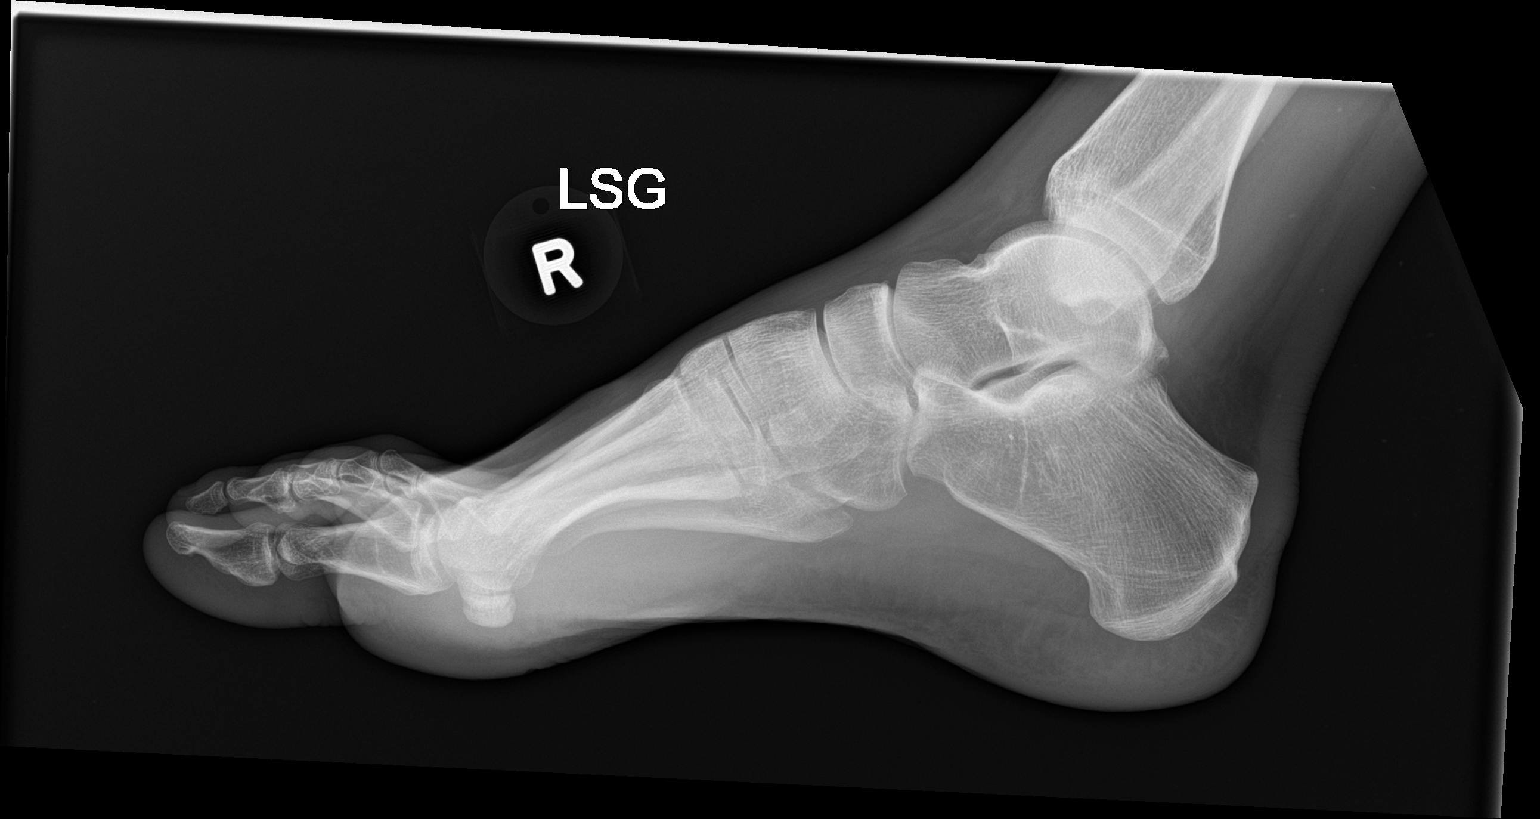

[3 of 3 positions shown; findings below may reference images not displayed]

FINDINGS: There is no evidence of fracture or dislocation. There is no
evidence of arthropathy or other focal bone abnormality. Soft
tissues are unremarkable.
IMPRESSION: Negative.

## 2019-09-20 ENCOUNTER — Other Ambulatory Visit: Payer: Self-pay

## 2019-09-20 ENCOUNTER — Ambulatory Visit
Admission: EM | Admit: 2019-09-20 | Discharge: 2019-09-20 | Discharge status: Against Medical Advice | Payer: Self-pay | Source: Home / Self Care

## 2019-09-20 ENCOUNTER — Ambulatory Visit: Admit: 2019-09-20 | Discharge status: Against Medical Advice | Payer: Self-pay

## 2019-09-20 ENCOUNTER — Ambulatory Visit
Admission: EM | Admit: 2019-09-20 | Discharge: 2019-09-20 | Disposition: A | Discharge status: Home / Self Care | Payer: Self-pay | Attending: Emergency Medicine | Admitting: Emergency Medicine

## 2019-09-20 ENCOUNTER — Encounter: Payer: Self-pay | Admitting: Emergency Medicine

## 2019-09-20 DIAGNOSIS — K047 Periapical abscess without sinus: Secondary | ICD-10-CM

## 2019-09-20 DIAGNOSIS — K0889 Other specified disorders of teeth and supporting structures: Secondary | ICD-10-CM

## 2019-09-20 HISTORY — DX: Anxiety disorder, unspecified: F41.9

## 2019-09-20 HISTORY — DX: Depression, unspecified: F32.A

## 2019-09-20 MED ORDER — AMOXICILLIN-POT CLAVULANATE 875-125 MG PO TABS: 1.0000 | ORAL_TABLET | Freq: Two times a day (BID) | ORAL | 0 refills | Status: AC

## 2019-09-20 MED ORDER — IBUPROFEN 600 MG PO TABS: 600.0000 mg | ORAL_TABLET | Freq: Four times a day (QID) | ORAL | 0 refills | Status: AC | PRN

## 2019-09-20 MED ORDER — HYDROCODONE-ACETAMINOPHEN 5-325 MG PO TABS: 1.0000 | ORAL_TABLET | Freq: Four times a day (QID) | ORAL | 0 refills | Status: AC | PRN

## 2019-09-20 MED ORDER — CHLORHEXIDINE GLUCONATE 0.12 % MT SOLN: OROMUCOSAL | 0 refills | Status: AC

## 2019-09-20 NOTE — Discharge Instructions (Addendum)
Finish the Augmentin, she has an antibiotic, even if you feel better.  600 mg of ibuprofen and a Tylenol containing product 3-4 times a day as needed for pain.  Either ibuprofen and 1000 mg of Tylenol for mild to moderate pain or ibuprofen and 1-2 Norco for severe pain.  Warm compresses, Peridex or Listerine.  OPTIONS FOR DENTAL FOLLOW UP CARE  [place] Department of Health and Human Services - Local Safety Net Dental Clinics [email or web address]   [facility] [phone])  Sharl Ma [phone])  [place] [phone])  [facility] [phone])  [facility] [phone]) This clinic caters to the indigent population and is on a lottery system. Location: [employer] [facility], [employer], [address] [place] Clinic Hours: Wednesdays from 6pm - 9pm, patients seen by a lottery system. For dates, call or go to [email or web address] Services: Cleanings, fillings and simple extractions. Payment Options: DENTAL WORK IS FREE OF CHARGE. Bring proof of income or support. Best way to get seen: Arrive at 5:15 pm - this is a lottery, NOT first come/first serve, so arriving earlier will not increase your chances of being seen.     [facility] [phone] Select option 1 for emergencies   Location: [facility], [place], [address] [place] Clinic Hours: No walk-ins accepted - call the day before to schedule an appointment. Check in times are 9:30 am and 1:30 pm. Services: Simple extractions, temporary fillings, pulpectomy/pulp debridement, uncomplicated abscess drainage. Payment Options: PAYMENT IS DUE AT THE TIME OF SERVICE.  Fee is usually $100-200, additional surgical procedures (e.g. abscess drainage) may be extra. Cash, checks, Visa/MasterCard accepted.  Can file Medicaid if  patient is covered for dental - patient should call case worker to check. No discount for [facility] patients. Best way to get seen: MUST call the day before and get onto the schedule. Can usually be seen the next 1-2 days. No walk-ins accepted.     [facility] [phone]   Location: [facility], [address], [place] Clinic Hours: M, W, Th, F 8am or 1:30pm, Tues 9a or 1:30 - first come/first served. Services: Simple extractions, temporary fillings, uncomplicated abscess drainage.  You do not need to be an [facility] resident. Payment Options: PAYMENT IS DUE AT THE TIME OF SERVICE. Dental insurance, otherwise sliding scale - bring proof of income or support. Depending on income and treatment needed, cost is usually $50-200. Best way to get seen: Arrive early as it is first come/first served.     [facility] [phone]   Location: [address] Clinic Hours: Mon-Thu 8a-5p Services: Most basic dental services including extractions and fillings. Payment Options: PAYMENT IS DUE AT THE TIME OF SERVICE. Sliding scale, up to 50% off - bring proof if income or support. Medicaid with dental option accepted. Best way to get seen: Call to schedule an appointment, can usually be seen within 2 weeks OR they will try to see walk-ins - show up at 8a or 2p (you may have to wait).     [facility] [phone] [place] RESIDENTS ONLY   Location: [facility], [address] [place], [place] [address] Clinic Hours: By appointment only. Monday - Thursday 8am-5pm, Friday 8am-12pm Services: Cleanings, fillings, extractions. Payment Options: PAYMENT IS DUE AT THE TIME OF SERVICE. Cash, Visa or MasterCard. Sliding scale - $30 minimum per  service. Best way to get seen: Come in to office, complete packet and make an appointment - need proof of income or  support monies for each household member and proof of [facility] residence. Usually takes about a month to get in.     [facility] [phone]   Location: [address] [facility] Clinic Hours: Walk-in Urgent Care Dental Services are offered Monday-Friday mornings only. The numbers of emergencies accepted daily is limited to the number of providers available. Maximum 15 - Mondays, Wednesdays & Thursdays Maximum 10 - Tuesdays & Fridays Services: You do not need to be a [facility] resident to be seen for a dental emergency. Emergencies are defined as pain, swelling, abnormal bleeding, or dental trauma. Walkins will receive x-rays if needed. NOTE: Dental cleaning is not an emergency. Payment Options: PAYMENT IS DUE AT THE TIME OF SERVICE. Minimum co-pay is $40.00 for uninsured patients. Minimum co-pay is $3.00 for Medicaid with dental coverage. Dental Insurance is accepted and must be presented at time of visit. Medicare does not cover dental. Forms of payment: Cash, credit card, checks. Best way to get seen: If not previously registered with the clinic, walk-in dental registration begins at 7:15 am and is on a first come/first serve basis. If previously registered with the clinic, call to make an appointment.     [facility] [phone] [place] RESIDENTS ONLY   Location: [address], [place], [place] Clinic Hours: Mon-Thu 10a-2p Services: Extractions only! Payment Options: FREE (donations accepted) - bring proof of income or support Best way to get seen: Call and schedule an appointment OR come at 8am on the 1st Monday of every month (except for holidays) when it is first come/first served.     [facility] [phone]   Location: [address] [place], [place] Clinic Hours: Friday mornings Services, Payment Options, Best way to get seen: Call for info   Here is a list of primary care providers who are taking  new patients:  Dr. Elizabeth Sauer [address] [place] [address] [phone]  [facility] [address] [place], [place] [address] [phone]  [facility] [place] [address]  [place] [address]  [phone]  [facility] [address] [place], [place] [address] [phone]  [facility] [address] [place]  [phone] [place], [place] [address]  Here are clinics/ other resources who will see you if you do not have insurance. Some have certain criteria that you must meet. Call them and find out what they are:  [facility]: [address], [place], [place] [address] Phone: [phone] Hours: First and Third Saturdays of each Month, 9 a.m. - 1 p.m.  [facility]: [address], [address] [place], [place] [address] Phone: [phone] Hours: Tuesday, 4 p.m. - 8 p.m. Thursday, 1 p.m. - 8 p.m. Wednesday, 9 a.m. - [facility] [address], [place], [place] [address] Phone: [phone] Pharmacy Phone Number: [phone] Dental Phone Number: [phone] [facility] Insurance Help: [phone]  Dental Hours: Monday - Thursday, 8 a.m. - 6 p.m.  [facility] [address], [place], [place] [address] Phone: [phone] Pharmacy Phone Number: [phone] [facility] Insurance Help: [phone]  [facility] [address] [place] [place], [place] [address] Phone: [phone] Pharmacy Phone Number: [phone] [facility] Insurance Help: [phone]  [facility] [address] [place], [place] [address] Phone:  [phone] [facility] Insurance Help: [phone]   [facility] [address], [place], [place] [address] Phone: [phone]  Go to www.goodrx.com to look up your medications. This will give you a list of where you can find your prescriptions at the most affordable prices. Or ask the pharmacist what the cash  price is, or if they have any other discount programs available to help make your medication more affordable. This can be less expensive than what you would pay with insurance.

## 2019-09-20 NOTE — ED Triage Notes (Signed)
Patient in today c/o dental pain x 2 weeks, worse over the last 4 days. Patient has taken OTC Ibuprofen without relief.

## 2019-09-20 NOTE — ED Provider Notes (Signed)
HPI  SUBJECTIVE:  Eric Rush is a 34 y.o. male who presents with 2 weeks of constant, throbbing posterior upper rear dental pain that has gotten worse over the past 4 weeks.  Reports headaches, gingival swelling, sensitivity to temperature and air.  No fevers, drooling, trismus, facial swelling, swelling underneath the tongue or jaw.  No true dental trauma.  No drainage from the gums.  No antibiotics in the past month.  He is taking ibuprofen within 6 hours of evaluation.  He has tried Orajel without improvement in his symptoms.  He also tried ibuprofen 200 mg once.  No alleviating factors.  Symptoms worse with exposure to air.  He is a smoker.  No history of diabetes.  PMD: None.  Dentist: None..  Past Medical History:  Diagnosis Date  . Anxiety   . Depression     Past Surgical History:  Procedure Laterality Date  . no past    . NO PAST SURGERIES      Family History  Problem Relation Age of Onset  . Other Mother        unknown medical history  . Valvular heart disease Father     Social History   Tobacco Use  . Smoking status: Current Every Day Smoker    Packs/day: 1.00    Years: 15.00    Pack years: 15.00    Types: Cigarettes  . Smokeless tobacco: Never Used  Vaping Use  . Vaping Use: Never used  Substance Use Topics  . Alcohol use: No  . Drug use: Yes    Types: Marijuana    Comment: once per month    No current facility-administered medications for this encounter.  Current Outpatient Medications:  .  amoxicillin-clavulanate (AUGMENTIN) 875-125 MG tablet, Take 1 tablet by mouth 2 (two) times daily for 7 days., Disp: 14 tablet, Rfl: 0 .  chlorhexidine (PERIDEX) 0.12 % solution, 15 mL swish and spit bid, Disp: 480 mL, Rfl: 0 .  HYDROcodone-acetaminophen (NORCO/VICODIN) 5-325 MG tablet, Take 1-2 tablets by mouth every 6 (six) hours as needed for moderate pain or severe pain., Disp: 12 tablet, Rfl: 0 .  ibuprofen (ADVIL) 600 MG tablet, Take 1 tablet (600 mg  total) by mouth every 6 (six) hours as needed., Disp: 30 tablet, Rfl: 0 .  OXcarbazepine (TRILEPTAL) 150 MG tablet, Take 1 tablet (150 mg total) by mouth 2 (two) times daily., Disp: 60 tablet, Rfl: 1 .  QUEtiapine (SEROQUEL) 100 MG tablet, Take 1 tablet (100 mg total) by mouth at bedtime., Disp: 30 tablet, Rfl: 1  Allergies  Allergen Reactions  . Depakote [Divalproex Sodium] Other (See Comments)    liver     ROS  As noted in HPI.   Physical Exam  BP 113/81 (BP Location: Left Arm)   Pulse 63   Temp 98.3 F (36.8 C) (Oral)   Resp 18   Ht 6\' 1"  (1.854 m)   Wt 76.2 kg   SpO2 100%   BMI 22.16 kg/m   Constitutional: Well developed, well nourished, appears uncomfortable. Eyes:  EOMI, conjunctiva normal bilaterally HENT: Normocephalic, atraumatic,mucus membranes moist.  Extensive gingival swelling left upper jaw.  Hard palate normal.  Missing tooth #10.  Tooth #16, third molar tender to palpation.  Appears intact.  No expressible purulent drainage from the gums.  No facial swelling.  No trismus. Neck: No cervical lymphadenopathy Respiratory: Normal inspiratory effort Cardiovascular: Normal rate GI: nondistended skin: No rash, skin intact Musculoskeletal: no deformities Neurologic: Alert & oriented x  3, no focal neuro deficits Psychiatric: Speech and behavior appropriate   ED Course   Medications - No data to display  No orders of the defined types were placed in this encounter.   No results found for this or any previous visit (from the past 24 hour(s)). No results found.  ED Clinical Impression  1. Dental abscess   2. Pain, dental      ED Assessment/Plan  [place] Narcotic database reviewed for this patient, and feel that the risk/benefit ratio today is favorable for proceeding with a prescription for controlled substance.  No opiate prescriptions in 2  years  Patient with an abscessed wisdom tooth.  Home with Augmentin, ibuprofen and a Tylenol containing product 3-4  times a day as needed for pain.  Either ibuprofen and 1000 mg of Tylenol for mild to moderate pain or ibuprofen in 1-2 Norco for severe pain.  Advised him that he will get 1 prescription of narcotics.  Warm compresses, Peridex or Listerine.  Dental list.  Primary care provider list.    Meds ordered this encounter  Medications  . chlorhexidine (PERIDEX) 0.12 % solution    Sig: 15 mL swish and spit bid    Dispense:  480 mL    Refill:  0  . HYDROcodone-acetaminophen (NORCO/VICODIN) 5-325 MG tablet    Sig: Take 1-2 tablets by mouth every 6 (six) hours as needed for moderate pain or severe pain.    Dispense:  12 tablet    Refill:  0  . amoxicillin-clavulanate (AUGMENTIN) 875-125 MG tablet    Sig: Take 1 tablet by mouth 2 (two) times daily for 7 days.    Dispense:  14 tablet    Refill:  0  . ibuprofen (ADVIL) 600 MG tablet    Sig: Take 1 tablet (600 mg total) by mouth every 6 (six) hours as needed.    Dispense:  30 tablet    Refill:  0    *This clinic note was created using Scientist, clinical (histocompatibility and immunogenetics). Therefore, there may be occasional mistakes despite careful proofreading.   ?    Domenick Gong, MD 09/21/19 1600

## 2020-08-15 ENCOUNTER — Ambulatory Visit: Payer: Self-pay

## 2023-07-25 ENCOUNTER — Encounter: Payer: Self-pay | Admitting: Emergency Medicine

## 2023-07-25 ENCOUNTER — Ambulatory Visit
Admission: EM | Admit: 2023-07-25 | Discharge: 2023-07-25 | Disposition: A | Discharge status: Home / Self Care | Payer: Self-pay | Attending: Family Medicine | Admitting: Family Medicine

## 2023-07-25 DIAGNOSIS — T63481A Toxic effect of venom of other arthropod, accidental (unintentional), initial encounter: Secondary | ICD-10-CM

## 2023-07-25 NOTE — ED Provider Notes (Signed)
 [facility]    CSN: [id number] Arrival date & time: 07/25/23  1030      History   Chief Complaint Chief Complaint  Patient presents with   Insect Bite    HPI Eric Rush is a 38 y.o. male presents for insect sting. Pt states yesterday while cleaning a gutter he was stung by a bee or wasp on his left forearm. He states the area is swollen, red, and warm. No fevers, chills. Had some numbness or tingling in his left 2-3rd fingers but this has now resolved per patient. No facial, tongue, lip, or throat swelling. No difficulty breathing or swallowing. He has not taken any otc treatments for sx. No other concern at this time.   HPI  Past Medical History:  Diagnosis Date   Anxiety    Depression     Patient Active Problem List   Diagnosis Date Noted   Bipolar 1 disorder, depressed (HCC) 09/18/2015   Suicidal ideation 09/18/2015   Cannabis use disorder, moderate, dependence (HCC) 09/18/2015   Tobacco use disorder 09/18/2015    Past Surgical History:  Procedure Laterality Date   no past     NO PAST SURGERIES         Home Medications    Prior to Admission medications   Medication Sig Start Date End Date Taking? Authorizing Provider  chlorhexidine  (PERIDEX ) 0.12 % solution 15 mL swish and spit bid 09/20/19   Mortenson, Ashley, MD  HYDROcodone -acetaminophen  (NORCO/VICODIN) 5-325 MG tablet Take 1-2 tablets by mouth every 6 (six) hours as needed for moderate pain or severe pain. 09/20/19   Ethlyn Herd, MD  ibuprofen  (ADVIL ) 600 MG tablet Take 1 tablet (600 mg total) by mouth every 6 (six) hours as needed. 09/20/19   Ethlyn Herd, MD  OXcarbazepine  (TRILEPTAL ) 150 MG tablet Take 1 tablet (150 mg total) by mouth 2 (two) times daily. 09/20/15   Pucilowska, Jolanta B, MD  QUEtiapine  (SEROQUEL ) 100 MG tablet Take 1 tablet (100 mg total) by mouth at bedtime. 09/20/15   Pucilowska, Jolanta B, MD    Family History Family History  Problem Relation Age of  Onset   Other Mother        unknown medical history   Valvular heart disease Father     Social History Social History   Tobacco Use   Smoking status: Every Day    Current packs/day: 1.00    Average packs/day: 1 pack/day for 15.0 years (15.0 ttl pk-yrs)    Types: Cigarettes   Smokeless tobacco: Never  Vaping Use   Vaping status: Never Used  Substance Use Topics   Alcohol use: Yes    Comment: occ   Drug use: Yes    Types: Marijuana    Comment: once per month     Allergies   Depakote [divalproex sodium]   Review of Systems Review of Systems  Skin:        Insect sting to forearm      Physical Exam Triage Vital Signs ED Triage Vitals  Encounter Vitals Group     BP 07/25/23 1043 123/78     Girls Systolic BP Percentile --      Girls Diastolic BP Percentile --      Boys Systolic BP Percentile --      Boys Diastolic BP Percentile --      Pulse Rate 07/25/23 1043 66     Resp 07/25/23 1043 16     Temp 07/25/23 1043 98.3 F (36.8 C)  Temp Source 07/25/23 1043 Oral     SpO2 07/25/23 1043 98 %     Weight 07/25/23 1041 167 lb 15.9 oz (76.2 kg)     Height 07/25/23 1041 6' 1 (1.854 m)     Head Circumference --      Peak Flow --      Pain Score 07/25/23 1040 7     Pain Loc --      Pain Education --      Exclude from Growth Chart --    No data found.  Updated Vital Signs BP 123/78 (BP Location: Right Arm)   Pulse 66   Temp 98.3 F (36.8 C) (Oral)   Resp 16   Ht 6' 1 (1.854 m)   Wt 167 lb 15.9 oz (76.2 kg)   SpO2 98%   BMI 22.16 kg/m   Visual Acuity Right Eye Distance:   Left Eye Distance:   Bilateral Distance:    Right Eye Near:   Left Eye Near:    Bilateral Near:     Physical Exam Vitals and nursing note reviewed.  Constitutional:      General: He is not in acute distress.    Appearance: Normal appearance. He is not ill-appearing, toxic-appearing or diaphoretic.  HENT:     Head: Normocephalic and atraumatic.   Eyes:     Pupils: Pupils  are equal, round, and reactive to light.    Cardiovascular:     Rate and Rhythm: Normal rate.  Pulmonary:     Effort: Pulmonary effort is normal.   Skin:    General: Skin is warm and dry.         Comments: Single insect sting to left mid forearm. Mild swelling, erythema, and warmth without induration or fluctuance. Cap refill +2 in all fingers.  Reviewed exam   Neurological:     General: No focal deficit present.     Mental Status: He is alert and oriented to person, place, and time.   Psychiatric:        Mood and Affect: Mood normal.        Behavior: Behavior normal.      UC Treatments / Results  Labs (all labs ordered are listed, but only abnormal results are displayed) Labs Reviewed - No data to display  EKG   Radiology No results found.  Procedures Procedures (including critical care time)  Medications Ordered in UC Medications - No data to display  Initial Impression / Assessment and Plan / UC Course  I have reviewed the triage vital signs and the nursing notes.  Pertinent labs & imaging results that were available during my care of the patient were reviewed by me and considered in my medical decision making (see chart for details).    Reviewed exam and sx with patient. No red flags. Discussed local reaction to insect sting. No s/s of infection. Pt declined steroid shot for sx. Advised otc benadryl, hydrocortisone cream, and cool compresses as needed. PCP follow up if symptoms do not improve. ER precautions reviewed. Final Clinical Impressions(s) / UC Diagnoses   Final diagnoses:  Local reaction to insect sting, accidental or unintentional, initial encounter     Discharge Instructions      Start benadryl over the counter as needed for swelling or itching. You may use over the counter hydrocortisone cream as needed topically as needed. Follow up with your PCP if your symptoms do not improve. Please go to the ER for any worsening symptoms. Hope you feel  better soon!    ED Prescriptions   None    PDMP not reviewed this encounter.   Alleen Arbour, NP 07/25/23 1124

## 2023-07-25 NOTE — Discharge Instructions (Addendum)
 Start benadryl over the counter as needed for swelling or itching. You may use over the counter hydrocortisone cream as needed topically as needed. Follow up with your PCP if your symptoms do not improve. Please go to the ER for any worsening symptoms. Hope you feel better soon!

## 2023-07-25 NOTE — ED Triage Notes (Addendum)
 Pt states he was stung or bit by a wasp on the left arm yesterday. He has swelling, redness and tightness of the area. He states he also has numbness int he tips of index finger, middle and ring fingers. He has not tried anything for his symptoms. He states he has a known allergy to and has carried an epi pen in the past.

## 2023-07-26 ENCOUNTER — Ambulatory Visit: Payer: Self-pay
